# Patient Record
Sex: Male | Born: 1965 | Race: White | Hispanic: No | Marital: Married | Smoking: Former smoker
Health system: Southern US, Community
[De-identification: ages and names within clinical notes are randomized; demographics above are authoritative.]

## PROBLEM LIST (undated history)

## (undated) DIAGNOSIS — K5792 Diverticulitis of intestine, part unspecified, without perforation or abscess without bleeding: Secondary | ICD-10-CM

## (undated) HISTORY — PX: HERNIA REPAIR: SHX51

## (undated) HISTORY — PX: OTHER SURGICAL HISTORY: SHX169

---

## 2017-05-31 ENCOUNTER — Encounter (HOSPITAL_COMMUNITY): Payer: Self-pay | Admitting: Emergency Medicine

## 2017-05-31 ENCOUNTER — Ambulatory Visit (HOSPITAL_COMMUNITY)
Admission: EM | Admit: 2017-05-31 | Discharge: 2017-05-31 | Disposition: A | Discharge status: Home / Self Care | Payer: BLUE CROSS/BLUE SHIELD | Attending: Family Medicine | Admitting: Family Medicine

## 2017-05-31 DIAGNOSIS — R110 Nausea: Secondary | ICD-10-CM

## 2017-05-31 DIAGNOSIS — R1084 Generalized abdominal pain: Secondary | ICD-10-CM

## 2017-05-31 DIAGNOSIS — R197 Diarrhea, unspecified: Secondary | ICD-10-CM

## 2017-05-31 MED ORDER — DICYCLOMINE HCL 20 MG PO TABS: 20.0000 mg | ORAL_TABLET | Freq: Two times a day (BID) | ORAL | 0 refills | Status: DC

## 2017-05-31 MED ORDER — ONDANSETRON 4 MG PO TBDP: 4.0000 mg | ORAL_TABLET | Freq: Three times a day (TID) | ORAL | 0 refills | Status: DC | PRN

## 2017-05-31 MED ORDER — LOPERAMIDE HCL 2 MG PO CAPS: 2.0000 mg | ORAL_CAPSULE | Freq: Four times a day (QID) | ORAL | 0 refills | Status: DC | PRN

## 2017-05-31 NOTE — ED Triage Notes (Signed)
[facility] a complaint of lower abdominal pain with nausea that started this morning after eating. The patient reported a loose stool earlier this am.

## 2017-05-31 NOTE — ED Provider Notes (Signed)
CSN: [id number]     Arrival date & time 05/31/17  1948 History   None    Chief Complaint  Patient presents with  . Abdominal Pain   (Consider location/radiation/quality/duration/timing/severity/associated sxs/prior Treatment) The history is provided by the patient.  Abdominal Pain  Pain location:  Generalized Pain quality: bloating and cramping   Pain radiation: non radiating  Pain severity:  Moderate Onset quality:  Gradual Timing:  Intermittent Progression:  Waxing and waning Chronicity:  New Relieved by:  Nothing Worsened by:  Eating Associated symptoms: diarrhea and nausea   Associated symptoms: no belching, no dysuria, no fatigue, no fever, no hematemesis, no hematochezia, no hematuria and no melena   51 yo male presented with CC of generalized cramping abdominal pain with nausea started after eating breakfast in [employer]. Sx worsens after eating lunch. Denies blood or mucus in stool. Denies fever/chills.    History reviewed. No pertinent past medical history. History reviewed. No pertinent surgical history. History reviewed. No pertinent family history. Social History  Substance Use Topics  . Smoking status: Current Every Day Smoker    Packs/day: 1.00    Years: 20.00    Types: Cigarettes  . Smokeless tobacco: Never Used  . Alcohol use Yes    Review of Systems  Constitutional: Negative for fatigue and fever.  Gastrointestinal: Positive for abdominal pain, diarrhea and nausea. Negative for hematemesis, hematochezia and melena.  Genitourinary: Negative for dysuria, flank pain, frequency, hematuria and urgency.    Allergies  Patient has no known allergies.  Home Medications   Prior to Admission medications   Not on File   Meds Ordered and Administered this Visit  Medications - No data to display  BP 115/64 (BP Location: Right Arm)   Pulse 89   Temp 98.4 F (36.9 C) (Oral)   Resp 18   SpO2 97%  No data found.   Physical Exam  Constitutional: He is  oriented to person, place, and time. He appears well-developed and well-nourished. No distress.  HENT:  Head: Normocephalic.  Eyes: Pupils are equal, round, and reactive to light.  Cardiovascular: Normal rate, regular rhythm and normal heart sounds.   Pulmonary/Chest: Effort normal and breath sounds normal. No respiratory distress.  Abdominal: Soft. Bowel sounds are normal. He exhibits no distension and no mass. There is tenderness (diffuse cramping abdominal pain with diarrhea and nausea. Reports 4-5 loose stools since morning, worse afetr eating. Denies blood or mucus in stool. Denies fever/chills. Denies change in urinary habits). There is no rebound and no guarding.  Neurological: He is alert and oriented to person, place, and time.  Skin: Skin is warm.    Urgent Care Course     Procedures (including critical care time)  Labs Review Labs Reviewed - No data to display  Imaging Review No results found.   Visual Acuity Review  Right Eye Distance:   Left Eye Distance:   Bilateral Distance:    Right Eye Near:   Left Eye Near:    Bilateral Near:         MDM   1. Generalized abdominal pain   2. Diarrhea, unspecified type   3. Nausea without vomiting   Highly likely a viral gastroenteritis. Advised for Bowel rest x 24 hrs. Clear liquid and lactose free diet. Slowly introduced BRAT diet as tolerated. If Sx worsens return to clinic or go to ED.    Reinaldo RaddleMultani, Mahreen Schewe, NP 05/31/17 2042

## 2017-05-31 NOTE — Discharge Instructions (Signed)
Advised for Bowel rest x 24 hrs. Clear liquid and lactose free diet. Slowly introduced BRAT diet as tolerated. If Sx worsens return to clinic or go to ED.

## 2017-06-05 ENCOUNTER — Inpatient Hospital Stay (HOSPITAL_COMMUNITY): Payer: BLUE CROSS/BLUE SHIELD | Admitting: Certified Registered Nurse Anesthetist

## 2017-06-05 ENCOUNTER — Inpatient Hospital Stay: Admit: 2017-06-05 | Payer: BLUE CROSS/BLUE SHIELD | Admitting: General Surgery

## 2017-06-05 ENCOUNTER — Inpatient Hospital Stay (HOSPITAL_COMMUNITY)
Admission: EM | Admit: 2017-06-05 | Discharge: 2017-06-11 | DRG: 329 | Disposition: A | Discharge status: Home / Self Care | Payer: BLUE CROSS/BLUE SHIELD | Attending: Surgery | Admitting: Surgery

## 2017-06-05 ENCOUNTER — Encounter (HOSPITAL_COMMUNITY): Payer: Self-pay

## 2017-06-05 ENCOUNTER — Encounter (HOSPITAL_COMMUNITY): Admission: EM | Disposition: A | Discharge status: Home / Self Care | Payer: Self-pay | Source: Home / Self Care

## 2017-06-05 ENCOUNTER — Emergency Department (HOSPITAL_COMMUNITY): Payer: BLUE CROSS/BLUE SHIELD

## 2017-06-05 DIAGNOSIS — F17210 Nicotine dependence, cigarettes, uncomplicated: Secondary | ICD-10-CM | POA: Diagnosis present

## 2017-06-05 DIAGNOSIS — K5720 Diverticulitis of large intestine with perforation and abscess without bleeding: Secondary | ICD-10-CM | POA: Diagnosis present

## 2017-06-05 DIAGNOSIS — Z79899 Other long term (current) drug therapy: Secondary | ICD-10-CM | POA: Diagnosis not present

## 2017-06-05 DIAGNOSIS — R188 Other ascites: Secondary | ICD-10-CM | POA: Diagnosis present

## 2017-06-05 DIAGNOSIS — K651 Peritoneal abscess: Secondary | ICD-10-CM | POA: Diagnosis present

## 2017-06-05 DIAGNOSIS — K56609 Unspecified intestinal obstruction, unspecified as to partial versus complete obstruction: Secondary | ICD-10-CM | POA: Diagnosis present

## 2017-06-05 DIAGNOSIS — K668 Other specified disorders of peritoneum: Secondary | ICD-10-CM

## 2017-06-05 DIAGNOSIS — K631 Perforation of intestine (nontraumatic): Secondary | ICD-10-CM | POA: Diagnosis present

## 2017-06-05 HISTORY — PX: COLON RESECTION: SHX5231

## 2017-06-05 LAB — CBC WITH DIFFERENTIAL/PLATELET
BASOS PCT: 0 %
Basophils Absolute: 0 10*3/uL (ref 0.0–0.1)
EOS ABS: 0.2 10*3/uL (ref 0.0–0.7)
EOS PCT: 2 %
HCT: 43.7 % (ref 39.0–52.0)
Hemoglobin: 15.8 g/dL (ref 13.0–17.0)
Lymphocytes Relative: 9 %
Lymphs Abs: 0.9 10*3/uL (ref 0.7–4.0)
MCH: 31.5 pg (ref 26.0–34.0)
MCHC: 36.2 g/dL — AB (ref 30.0–36.0)
MCV: 87.1 fL (ref 78.0–100.0)
MONO ABS: 1.4 10*3/uL — AB (ref 0.1–1.0)
MONOS PCT: 14 %
NEUTROS PCT: 75 %
Neutro Abs: 7.7 10*3/uL (ref 1.7–7.7)
PLATELETS: 281 10*3/uL (ref 150–400)
RBC: 5.02 MIL/uL (ref 4.22–5.81)
RDW: 12.5 % (ref 11.5–15.5)
WBC: 10.2 10*3/uL (ref 4.0–10.5)

## 2017-06-05 LAB — LIPASE, BLOOD: Lipase: 19 U/L (ref 11–51)

## 2017-06-05 LAB — COMPREHENSIVE METABOLIC PANEL
ALBUMIN: 3.4 g/dL — AB (ref 3.5–5.0)
ALT: 21 U/L (ref 17–63)
ANION GAP: 11 (ref 5–15)
AST: 23 U/L (ref 15–41)
Alkaline Phosphatase: 54 U/L (ref 38–126)
BUN: 19 mg/dL (ref 6–20)
CO2: 29 mmol/L (ref 22–32)
Calcium: 8.9 mg/dL (ref 8.9–10.3)
Chloride: 91 mmol/L — ABNORMAL LOW (ref 101–111)
Creatinine, Ser: 0.77 mg/dL (ref 0.61–1.24)
GFR calc Af Amer: >60 mL/min (ref >60)
GFR calc non Af Amer: >60 mL/min (ref >60)
GLUCOSE: 104 mg/dL — AB (ref 65–99)
POTASSIUM: 3.3 mmol/L — AB (ref 3.5–5.1)
SODIUM: 131 mmol/L — AB (ref 135–145)
Total Bilirubin: 1.1 mg/dL (ref 0.3–1.2)
Total Protein: 7.3 g/dL (ref 6.5–8.1)

## 2017-06-05 SURGERY — EXCISION, SMALL INTESTINE, LAPAROSCOPIC
Anesthesia: General

## 2017-06-05 SURGERY — COLON RESECTION LAPAROSCOPIC
Anesthesia: General

## 2017-06-05 MED ORDER — SODIUM CHLORIDE 0.9 % IV BOLUS (SEPSIS)
1000.0000 mL | Freq: Once | INTRAVENOUS | Status: AC
  Administered 2017-06-05: 1000 mL via INTRAVENOUS

## 2017-06-05 MED ORDER — ONDANSETRON HCL 4 MG/2ML IJ SOLN: 4.0000 mg | Freq: Four times a day (QID) | INTRAMUSCULAR | Status: DC | PRN

## 2017-06-05 MED ORDER — ALBUTEROL SULFATE HFA 108 (90 BASE) MCG/ACT IN AERS
INHALATION_SPRAY | RESPIRATORY_TRACT | Status: AC
  Filled 2017-06-05: qty 6.7

## 2017-06-05 MED ORDER — IOPAMIDOL (ISOVUE-300) INJECTION 61%
100.0000 mL | Freq: Once | INTRAVENOUS | Status: AC | PRN
  Administered 2017-06-05: 100 mL via INTRAVENOUS

## 2017-06-05 MED ORDER — HYDROMORPHONE HCL 1 MG/ML IJ SOLN
INTRAMUSCULAR | Status: AC
  Filled 2017-06-05: qty 0.5

## 2017-06-05 MED ORDER — SUCCINYLCHOLINE CHLORIDE 20 MG/ML IJ SOLN
INTRAMUSCULAR | Status: DC | PRN
  Administered 2017-06-05: 120 mg via INTRAVENOUS

## 2017-06-05 MED ORDER — SODIUM CHLORIDE 0.9 % IV BOLUS (SEPSIS): 1000.0000 mL | Freq: Once | INTRAVENOUS | Status: DC

## 2017-06-05 MED ORDER — ONDANSETRON HCL 4 MG/2ML IJ SOLN
4.0000 mg | Freq: Once | INTRAMUSCULAR | Status: AC
  Administered 2017-06-05: 4 mg via INTRAVENOUS
  Filled 2017-06-05: qty 2

## 2017-06-05 MED ORDER — LIDOCAINE 2% (20 MG/ML) 5 ML SYRINGE
INTRAMUSCULAR | Status: AC
  Filled 2017-06-05: qty 5

## 2017-06-05 MED ORDER — HYDROMORPHONE HCL 1 MG/ML IJ SOLN
0.5000 mg | INTRAMUSCULAR | Status: DC | PRN
  Administered 2017-06-05: 1 mg via INTRAVENOUS
  Administered 2017-06-06: 2 mg via INTRAVENOUS
  Administered 2017-06-06 (×4): 1 mg via INTRAVENOUS
  Administered 2017-06-06 (×2): 2 mg via INTRAVENOUS
  Administered 2017-06-07: 1 mg via INTRAVENOUS
  Filled 2017-06-05 (×2): qty 1
  Filled 2017-06-05 (×3): qty 2
  Filled 2017-06-05 (×4): qty 1

## 2017-06-05 MED ORDER — SUGAMMADEX SODIUM 200 MG/2ML IV SOLN
INTRAVENOUS | Status: DC | PRN
  Administered 2017-06-05: 150 mg via INTRAVENOUS

## 2017-06-05 MED ORDER — PANTOPRAZOLE SODIUM 40 MG IV SOLR
40.0000 mg | Freq: Two times a day (BID) | INTRAVENOUS | Status: DC
  Administered 2017-06-06 – 2017-06-09 (×7): 40 mg via INTRAVENOUS
  Filled 2017-06-05 (×8): qty 40

## 2017-06-05 MED ORDER — PROMETHAZINE HCL 25 MG/ML IJ SOLN: 6.2500 mg | INTRAMUSCULAR | Status: DC | PRN

## 2017-06-05 MED ORDER — MORPHINE SULFATE (PF) 4 MG/ML IV SOLN
6.0000 mg | Freq: Once | INTRAVENOUS | Status: AC
  Administered 2017-06-05: 6 mg via INTRAVENOUS
  Filled 2017-06-05: qty 2

## 2017-06-05 MED ORDER — SODIUM CHLORIDE 0.9 % IV SOLN
INTRAVENOUS | Status: DC | PRN
Start: 2017-06-05 — End: 2017-06-05
  Administered 2017-06-05: 20:00:00 via INTRAVENOUS

## 2017-06-05 MED ORDER — DIPHENHYDRAMINE HCL 50 MG/ML IJ SOLN: 25.0000 mg | Freq: Four times a day (QID) | INTRAMUSCULAR | Status: DC | PRN

## 2017-06-05 MED ORDER — FENTANYL CITRATE (PF) 250 MCG/5ML IJ SOLN
INTRAMUSCULAR | Status: AC
  Filled 2017-06-05: qty 5

## 2017-06-05 MED ORDER — PROPOFOL 10 MG/ML IV BOLUS
INTRAVENOUS | Status: AC
  Filled 2017-06-05: qty 20

## 2017-06-05 MED ORDER — ALBUTEROL SULFATE (2.5 MG/3ML) 0.083% IN NEBU
2.5000 mg | INHALATION_SOLUTION | Freq: Four times a day (QID) | RESPIRATORY_TRACT | Status: DC | PRN
  Administered 2017-06-06: 2.5 mg via RESPIRATORY_TRACT
  Filled 2017-06-05: qty 3

## 2017-06-05 MED ORDER — POTASSIUM CHLORIDE IN NACL 40-0.9 MEQ/L-% IV SOLN
INTRAVENOUS | Status: DC
  Administered 2017-06-06 (×2): 125 mL/h via INTRAVENOUS
  Filled 2017-06-05 (×2): qty 1000

## 2017-06-05 MED ORDER — PIPERACILLIN-TAZOBACTAM 3.375 G IVPB
3.3750 g | Freq: Three times a day (TID) | INTRAVENOUS | Status: DC
  Administered 2017-06-06 – 2017-06-07 (×4): 3.375 g via INTRAVENOUS
  Filled 2017-06-05 (×7): qty 50

## 2017-06-05 MED ORDER — HYDROMORPHONE HCL 1 MG/ML IJ SOLN
INTRAMUSCULAR | Status: AC
  Filled 2017-06-05: qty 1

## 2017-06-05 MED ORDER — LACTATED RINGERS IV SOLN
INTRAVENOUS | Status: DC | PRN
  Administered 2017-06-05 (×2): via INTRAVENOUS

## 2017-06-05 MED ORDER — MIDAZOLAM HCL 2 MG/2ML IJ SOLN
INTRAMUSCULAR | Status: AC
  Filled 2017-06-05: qty 2

## 2017-06-05 MED ORDER — HYDROMORPHONE HCL 1 MG/ML IJ SOLN: 0.5000 mg | INTRAMUSCULAR | Status: DC | PRN

## 2017-06-05 MED ORDER — ONDANSETRON HCL 4 MG/2ML IJ SOLN
INTRAMUSCULAR | Status: DC | PRN
  Administered 2017-06-05: 4 mg via INTRAVENOUS

## 2017-06-05 MED ORDER — HYDROMORPHONE HCL 2 MG/ML IJ SOLN
INTRAMUSCULAR | Status: AC
  Filled 2017-06-05: qty 1

## 2017-06-05 MED ORDER — ROCURONIUM BROMIDE 100 MG/10ML IV SOLN
INTRAVENOUS | Status: DC | PRN
  Administered 2017-06-05: 20 mg via INTRAVENOUS
  Administered 2017-06-05: 40 mg via INTRAVENOUS
  Administered 2017-06-05: 10 mg via INTRAVENOUS

## 2017-06-05 MED ORDER — HYDROMORPHONE HCL 1 MG/ML IJ SOLN
INTRAMUSCULAR | Status: DC | PRN
  Administered 2017-06-05 (×2): 1 mg via INTRAVENOUS

## 2017-06-05 MED ORDER — PIPERACILLIN-TAZOBACTAM 3.375 G IVPB 30 MIN
3.3750 g | Freq: Once | INTRAVENOUS | Status: AC
  Administered 2017-06-05: 3.375 g via INTRAVENOUS
  Filled 2017-06-05: qty 50

## 2017-06-05 MED ORDER — OXYCODONE HCL 5 MG PO TABS: 5.0000 mg | ORAL_TABLET | Freq: Once | ORAL | Status: DC | PRN

## 2017-06-05 MED ORDER — SUGAMMADEX SODIUM 200 MG/2ML IV SOLN
INTRAVENOUS | Status: AC
  Filled 2017-06-05: qty 2

## 2017-06-05 MED ORDER — FENTANYL CITRATE (PF) 100 MCG/2ML IJ SOLN
INTRAMUSCULAR | Status: DC | PRN
  Administered 2017-06-05: 100 ug via INTRAVENOUS
  Administered 2017-06-05 (×2): 50 ug via INTRAVENOUS
  Administered 2017-06-05 (×2): 100 ug via INTRAVENOUS
  Administered 2017-06-05 (×2): 50 ug via INTRAVENOUS

## 2017-06-05 MED ORDER — PROPOFOL 10 MG/ML IV BOLUS
INTRAVENOUS | Status: DC | PRN
  Administered 2017-06-05: 120 mg via INTRAVENOUS

## 2017-06-05 MED ORDER — HYDROMORPHONE HCL 1 MG/ML IJ SOLN
0.2500 mg | INTRAMUSCULAR | Status: DC | PRN
  Administered 2017-06-05 (×2): 0.25 mg via INTRAVENOUS
  Administered 2017-06-05: 0.5 mg via INTRAVENOUS
  Administered 2017-06-05 (×2): 0.25 mg via INTRAVENOUS
  Administered 2017-06-05: 0.5 mg via INTRAVENOUS

## 2017-06-05 MED ORDER — DEXAMETHASONE SODIUM PHOSPHATE 4 MG/ML IJ SOLN
INTRAMUSCULAR | Status: DC | PRN
  Administered 2017-06-05: 10 mg via INTRAVENOUS

## 2017-06-05 MED ORDER — NICOTINE 14 MG/24HR TD PT24
14.0000 mg | MEDICATED_PATCH | Freq: Every day | TRANSDERMAL | Status: DC
  Filled 2017-06-05 (×4): qty 1

## 2017-06-05 MED ORDER — OXYCODONE HCL 5 MG/5ML PO SOLN
5.0000 mg | Freq: Once | ORAL | Status: DC | PRN
  Filled 2017-06-05: qty 5

## 2017-06-05 MED ORDER — MORPHINE SULFATE (PF) 4 MG/ML IV SOLN: 4.0000 mg | Freq: Once | INTRAVENOUS | Status: DC

## 2017-06-05 MED ORDER — LIDOCAINE HCL (CARDIAC) 20 MG/ML IV SOLN
INTRAVENOUS | Status: DC | PRN
  Administered 2017-06-05: 75 mg via INTRAVENOUS
  Administered 2017-06-05: 25 mg via INTRAVENOUS

## 2017-06-05 MED ORDER — IOPAMIDOL (ISOVUE-300) INJECTION 61%
INTRAVENOUS | Status: AC
  Administered 2017-06-05: 100 mL via INTRAVENOUS
  Filled 2017-06-05: qty 100

## 2017-06-05 MED ORDER — ROCURONIUM BROMIDE 50 MG/5ML IV SOSY
PREFILLED_SYRINGE | INTRAVENOUS | Status: AC
  Filled 2017-06-05: qty 5

## 2017-06-05 MED ORDER — ONDANSETRON 4 MG PO TBDP: 4.0000 mg | ORAL_TABLET | Freq: Four times a day (QID) | ORAL | Status: DC | PRN

## 2017-06-05 MED ORDER — DIPHENHYDRAMINE HCL 25 MG PO CAPS: 25.0000 mg | ORAL_CAPSULE | Freq: Four times a day (QID) | ORAL | Status: DC | PRN

## 2017-06-05 MED ORDER — SUCCINYLCHOLINE CHLORIDE 200 MG/10ML IV SOSY
PREFILLED_SYRINGE | INTRAVENOUS | Status: AC
  Filled 2017-06-05: qty 10

## 2017-06-05 MED ORDER — MIDAZOLAM HCL 5 MG/5ML IJ SOLN
INTRAMUSCULAR | Status: DC | PRN
  Administered 2017-06-05: 2 mg via INTRAVENOUS

## 2017-06-05 SURGICAL SUPPLY — 64 items
APPLIER CLIP ROT 10 11.4 M/L (STAPLE)
BLADE EXTENDED COATED 6.5IN (ELECTRODE) ×3 IMPLANT
CABLE HIGH FREQUENCY MONO STRZ (ELECTRODE) IMPLANT
CELLS DAT CNTRL 66122 CELL SVR (MISCELLANEOUS) IMPLANT
CLIP APPLIE ROT 10 11.4 M/L (STAPLE) IMPLANT
COVER SURGICAL LIGHT HANDLE (MISCELLANEOUS) ×3 IMPLANT
CUTTER FLEX LINEAR 45M (STAPLE) IMPLANT
DECANTER SPIKE VIAL GLASS SM (MISCELLANEOUS) IMPLANT
DERMABOND ADVANCED (GAUZE/BANDAGES/DRESSINGS)
DERMABOND ADVANCED .7 DNX12 (GAUZE/BANDAGES/DRESSINGS) IMPLANT
DRAIN CHANNEL 19F RND (DRAIN) IMPLANT
DRAPE LAPAROSCOPIC ABDOMINAL (DRAPES) ×6 IMPLANT
DRAPE UTILITY XL STRL (DRAPES) ×3 IMPLANT
DRAPE WARM FLUID 44X44 (DRAPE) ×3 IMPLANT
DRSG OPSITE POSTOP 4X10 (GAUZE/BANDAGES/DRESSINGS) IMPLANT
DRSG OPSITE POSTOP 4X6 (GAUZE/BANDAGES/DRESSINGS) IMPLANT
DRSG OPSITE POSTOP 4X8 (GAUZE/BANDAGES/DRESSINGS) IMPLANT
ELECT PENCIL ROCKER SW 15FT (MISCELLANEOUS) ×3 IMPLANT
ELECT REM PT RETURN 15FT ADLT (MISCELLANEOUS) ×3 IMPLANT
GAUZE SPONGE 4X4 12PLY STRL (GAUZE/BANDAGES/DRESSINGS) IMPLANT
GLOVE BIOGEL PI IND STRL 7.5 (GLOVE) ×6 IMPLANT
GLOVE BIOGEL PI INDICATOR 7.5 (GLOVE) ×12
GLOVE ECLIPSE 7.5 STRL STRAW (GLOVE) ×6 IMPLANT
GOWN STRL REUS W/TWL XL LVL3 (GOWN DISPOSABLE) ×9 IMPLANT
IRRIG SUCT STRYKERFLOW 2 WTIP (MISCELLANEOUS)
IRRIGATION SUCT STRKRFLW 2 WTP (MISCELLANEOUS) IMPLANT
LEGGING LITHOTOMY PAIR STRL (DRAPES) IMPLANT
LIGASURE IMPACT 36 18CM CVD LR (INSTRUMENTS) ×3 IMPLANT
PACK COLON (CUSTOM PROCEDURE TRAY) IMPLANT
PACK GENERAL/GYN (CUSTOM PROCEDURE TRAY) ×3 IMPLANT
PORT LAP GEL ALEXIS MED 5-9CM (MISCELLANEOUS) IMPLANT
RELOAD BLUE CHELON 45 (STAPLE) IMPLANT
RELOAD PROXIMATE 75MM BLUE (ENDOMECHANICALS) ×3 IMPLANT
RTRCTR WOUND ALEXIS 18CM MED (MISCELLANEOUS)
SCISSORS LAP 5X35 DISP (ENDOMECHANICALS) IMPLANT
SEALER TISSUE X1 CVD JAW (INSTRUMENTS) IMPLANT
SHEARS HARMONIC ACE PLUS 36CM (ENDOMECHANICALS) IMPLANT
SLEEVE ADV FIXATION 5X100MM (TROCAR) IMPLANT
SOLUTION ANTI FOG 6CC (MISCELLANEOUS) IMPLANT
STAPLER PROXIMATE 75MM BLUE (STAPLE) ×3 IMPLANT
STAPLER VISISTAT 35W (STAPLE) IMPLANT
SUT MNCRL AB 4-0 PS2 18 (SUTURE) IMPLANT
SUT PDS AB 0 CT1 36 (SUTURE) IMPLANT
SUT PDS AB 0 CTX 60 (SUTURE) ×6 IMPLANT
SUT PDS AB 1 CTX 36 (SUTURE) IMPLANT
SUT PDS AB 1 TP1 96 (SUTURE) IMPLANT
SUT PROLENE 2 0 KS (SUTURE) IMPLANT
SUT PROLENE 3 0 SH 48 (SUTURE) ×3 IMPLANT
SUT SILK 2 0 (SUTURE) ×2
SUT SILK 2 0 SH CR/8 (SUTURE) ×3 IMPLANT
SUT SILK 2-0 18XBRD TIE 12 (SUTURE) ×1 IMPLANT
SUT SILK 3 0 (SUTURE) ×2
SUT SILK 3 0 SH CR/8 (SUTURE) ×3 IMPLANT
SUT SILK 3-0 18XBRD TIE 12 (SUTURE) ×1 IMPLANT
SUT VIC AB 3-0 SH 18 (SUTURE) ×3 IMPLANT
SYS LAPSCP GELPORT 120MM (MISCELLANEOUS)
SYSTEM LAPSCP GELPORT 120MM (MISCELLANEOUS) IMPLANT
TOWEL OR NON WOVEN STRL DISP B (DISPOSABLE) ×6 IMPLANT
TRAY FOLEY W/METER SILVER 16FR (SET/KITS/TRAYS/PACK) ×3 IMPLANT
TROCAR ADV FIXATION 12X100MM (TROCAR) IMPLANT
TROCAR ADV FIXATION 5X100MM (TROCAR) IMPLANT
TROCAR BLADELESS OPT 5 100 (ENDOMECHANICALS) IMPLANT
TROCAR XCEL BLUNT TIP 100MML (ENDOMECHANICALS) IMPLANT
TUBING INSUF HEATED (TUBING) IMPLANT

## 2017-06-05 NOTE — Progress Notes (Signed)
Pharmacy Antibiotic Note  Colton Stanley is a 51 y.o. male admitted on 06/05/2017 with intra-abdominal infection.  Patient brought from urgent care with SBO with potential perforation.  Patient received Zosyn 3.375gm IV x 1 dose in the ED.  Pharmacy has been consulted for Zosyn dosing.  Plan: Zosyn 3.375gm IV q8h (each dose infused over 4 hrs)  Height: 5\' 9"  (175.3 cm) Weight: 120 lb (54.4 kg) IBW/kg (Calculated) : 70.7  Temp (24hrs), Avg:98.9 F (37.2 C), Min:98.9 F (37.2 C), Max:98.9 F (37.2 C)   Recent Labs Lab 06/05/17 1218  WBC 10.2  CREATININE 0.77    Estimated Creatinine Clearance: 84.1 mL/min (by C-G formula based on SCr of 0.77 mg/dL).    No Known Allergies  Antimicrobials this admission: 6/18 zosyn >>     Thank you for allowing pharmacy to be a part of this patient's care.  Maryellen PilePoindexter, Anabelle Bungert Trefz, PharmD 06/05/2017 4:08 PM

## 2017-06-05 NOTE — Anesthesia Postprocedure Evaluation (Signed)
Anesthesia Post Note  Patient: Colton Stanley  Procedure(s) Performed: Procedure(s) (LRB): exploratory laparotomy, sigmoid colon resection, colostomy (N/A)     Patient location during evaluation: PACU Anesthesia Type: General Level of consciousness: awake and alert Pain management: pain level controlled Vital Signs Assessment: post-procedure vital signs reviewed and stable Respiratory status: spontaneous breathing, nonlabored ventilation, respiratory function stable and patient connected to nasal cannula oxygen Cardiovascular status: blood pressure returned to baseline and stable Postop Assessment: no signs of nausea or vomiting Anesthetic complications: no    Last Vitals:  Vitals:   06/05/17 2215 06/05/17 2238  BP:  124/72  Pulse:  75  Resp:  17  Temp: 37.2 C 36.8 C    Last Pain:  Vitals:   06/05/17 2215  TempSrc:   PainSc: 5                  Katricia Prehn P Nethaniel Mattie

## 2017-06-05 NOTE — Op Note (Signed)
Preoperative Diagnosis: Pelvic abscess and small bowel obstruction  Postoprative Diagnosis: Same secondary to perforated sigmoid diverticulitis with abscess  Procedure: Procedure(s): exploratory laparotomy, sigmoid colon resection, colostomy  Gertie Gowda colectomy)   Surgeon: Glenna Fellows T   Assistants: None  Anesthesia:  General endotracheal anesthesia  Indications: Patient is a 51 year old male who presents with 5-6 days of generalized crampy abdominal pain, obstipation and nausea. CT scan has shown high-grade small bowel obstruction with transition and thickened loops of small bowel in the pelvis with a small amount of intraperitoneal free air consistent with perforation. After discussion of the situation and options for treatment with the patient and his wife will elected to proceed with exploratory laparotomy. We discussed possible procedures including temporary colostomy.    Procedure Detail:  Patient was taken to the operating room, placed in supine position on the operating table, and general endotracheal anesthesia induced. He received broad-spectrum preoperative IV antibiotics. PAS were in place. Foley catheter and NG tube were placed. The abdomen was widely sterilely prepped and draped. Patient timeout was performed and correct procedure verified. A low midline incision below the umbilicus was used and dissection carried down through the subcutaneous tissue and midline fascia. The peritoneum was carefully opened. There was a moderate amount of straw-colored ascites was suctioned. There were multiple significantly dilated loops of small bowel. As I traced these down in the pelvis there were inflammatory adhesions between loops of small bowel with exudate and then some dense inflammatory adhesions to the mid sigmoid colon which was severely inflamed. As these were bluntly dissected off an abscess cavity was entered with frank purulence which was cultured and suctioned and evacuated.  The small bowel loops were all carefully bluntly dissected away from the sigmoid colon and there was a transition point here in the mid ileum secondary to these inflammatory adhesions. These were all completely broken up and was no intrinsic abnormality of the small intestine which was traced down to the terminal ileum and cecum and appendix which were all normal. There was an obvious segment of severely inflamed sigmoid colon with a small perforation. I elected proceed with [facility] colectomy. The sigmoid colon was mobilized dividing lateral peritoneal attachments. There were fairly dense inflammatory adhesions to the lateral pelvic sidewall. I could not identify the ureter due to the inflammation but the dissection was kept very close to the colon and I really did not go back into the retroperitoneum. After mobilization a point of proximal resection just proximal to the severely inflamed area was chosen and was cleaned of mesentery and divided with the GIA 75 mm stapler. Staying very close to the bowel wall the mesentery of the sigmoid colon was sequentially divided with the LigaSure working distally until the severely inflamed segment was mobilized up out of the pelvis and I encountered soft normal-appearing distal rectosigmoid. This was cleared of mesentery and divided with a second firing of the GIA 75 mm stapler and the specimen removed. The abdomen was thoroughly irrigated with multiple liters of warm saline. Hemostasis was assured. The small bowel was again carefully traced proximal to distal and was very dilated but no intrinsic abnormality. The NG tube was confirmed in the stomach. The rectosigmoid stump was marked with 2 2-0 Prolene sutures one on either end of the staple line. The left and proximal sigmoid colon was further mobilized in preparation for colostomy dividing lateral peritoneal attachments. A site for the colostomy in the left lower quadrant was chosen and a skin and subcutaneous button  excised. The anterior fascia was divided in a cruciate fashion. The rectus muscle was bluntly split and the peritoneum opened and dilated. The end of the sigmoid colon was brought out under no tension for the ostomy. The abdomen was again thoroughly irrigated and hemostasis assured. The omentum was brought down over loops of small bowel. The midline fascia was closed using running looped #1 PDS begun at either end of the incision and tied centrally. The ostomy was opened and everted and matured with interrupted 3-0 Vicryl. The midline wound was packed with moist saline gauze. Sponge and needle counts were counts were correct. Ostomy device and costal dressing were applied.    Findings: Perforated sigmoid diverticulitis with abscess and secondary small bowel obstruction  Estimated Blood Loss:  150 mL         Drains: None  Blood Given: none          Specimens: Sigmoid colon, culture and sensitivity        Complications:  * No complications entered in OR log *         Disposition: PACU - hemodynamically stable.         Condition: stable

## 2017-06-05 NOTE — ED Notes (Signed)
Pt is alert and oriented x 4 and is verbally responsive accompanied by spouse. Pt states that he has been having generalized abdominal x 1 week, described as sharp and constant.

## 2017-06-05 NOTE — ED Triage Notes (Signed)
[facility] Pt was c/o abd. pain x 5 days x 4 quads 8-10 described as sharp. Pt was seen in urgent care and had an Xray that showed SBO with potential perforation per EMS.    VS 60, RR 18, BP 126/86

## 2017-06-05 NOTE — ED Notes (Signed)
Called floor for transfer of patient.  RN busy at this time.  Will try again in 15 minutes.

## 2017-06-05 NOTE — ED Notes (Signed)
Floor stated RN is admitting another patient to the floor right now and will call me back.

## 2017-06-05 NOTE — ED Provider Notes (Signed)
[facility]-EMERGENCY DEPT Provider Note   CSN: [id number] Arrival date & time: 06/05/17  1204     History   Chief Complaint Chief Complaint  Patient presents with  . Abdominal Pain    HPI Colton Stanley is a 51 y.o. male.  HPI Patient presents emergency department with increasing abdominal discomfort over the past 4 days.  He describes the discomfort as sharp.  He was sent today from the urgent care after a plain film demonstrated small bowel structure with possible free air.  He's never had abdominal surgery.  No prior history of diverticulitis.  No fevers or chills.  Reports some nausea without vomiting.  Denies diarrhea.  No blood in his stool.  No recent weight change or weight loss.  No recent anorexia.  No history of cancer.   History reviewed. No pertinent past medical history.  There are no active problems to display for this patient.   No past surgical history on file.     Home Medications    Prior to Admission medications   Medication Sig Start Date End Date Taking? Authorizing Provider  dicyclomine (BENTYL) 20 MG tablet Take 1 tablet (20 mg total) by mouth 2 (two) times daily. 05/31/17  Yes Multani, Bhupinder, NP  loperamide (IMODIUM) 2 MG capsule Take 1 capsule (2 mg total) by mouth 4 (four) times daily as needed for diarrhea or loose stools. 05/31/17  Yes Multani, Bhupinder, NP  ondansetron (ZOFRAN ODT) 4 MG disintegrating tablet Take 1 tablet (4 mg total) by mouth every 8 (eight) hours as needed for nausea or vomiting. 05/31/17  Yes Multani, Bhupinder, NP    Family History History reviewed. No pertinent family history.  Social History Social History  Substance Use Topics  . Smoking status: Current Every Day Smoker    Packs/day: 1.00    Years: 20.00    Types: Cigarettes  . Smokeless tobacco: Never Used  . Alcohol use Yes     Allergies   Patient has no known allergies.   Review of Systems Review of Systems  All other systems reviewed and are  negative.    Physical Exam Updated Vital Signs BP 118/83   Pulse 61   Temp 98.9 F (37.2 C) (Oral)   Resp 20   Ht 5\' 9"  (1.753 m)   Wt 54.4 kg (120 lb)   SpO2 99%   BMI 17.72 kg/m   Physical Exam  Constitutional: He is oriented to person, place, and time. He appears well-developed and well-nourished.  HENT:  Head: Normocephalic and atraumatic.  Eyes: EOM are normal.  Neck: Normal range of motion.  Cardiovascular: Normal rate, regular rhythm, normal heart sounds and intact distal pulses.   Pulmonary/Chest: Effort normal and breath sounds normal. No respiratory distress.  Abdominal: Soft. He exhibits no distension.  Mild generalized abdominal tenderness  Musculoskeletal: Normal range of motion.  Neurological: He is alert and oriented to person, place, and time.  Skin: Skin is warm and dry.  Psychiatric: He has a normal mood and affect. Judgment normal.  Nursing note and vitals reviewed.    ED Treatments / Results  Labs (all labs ordered are listed, but only abnormal results are displayed) Labs Reviewed  CBC WITH DIFFERENTIAL/PLATELET - Abnormal; Notable for the following:       Result Value   MCHC 36.2 (*)    Monocytes Absolute 1.4 (*)    All other components within normal limits  COMPREHENSIVE METABOLIC PANEL - Abnormal; Notable for the following:    Sodium 131 (*)  Potassium 3.3 (*)    Chloride 91 (*)    Glucose, Bld 104 (*)    Albumin 3.4 (*)    All other components within normal limits  LIPASE, BLOOD    EKG  EKG Interpretation None       Radiology Ct Abdomen Pelvis W Contrast  Result Date: 06/05/2017 CLINICAL DATA:  Upper abdominal pain. EXAM: CT ABDOMEN AND PELVIS WITH CONTRAST TECHNIQUE: Multidetector CT imaging of the abdomen and pelvis was performed using the standard protocol following bolus administration of intravenous contrast. CONTRAST:  100 mL Isovue-300 COMPARISON:  None. FINDINGS: Lower chest: No acute abnormality. Hepatobiliary: No  focal liver abnormality is seen. No gallstones, gallbladder wall thickening, or biliary dilatation. Pancreas: Unremarkable. No pancreatic ductal dilatation or surrounding inflammatory changes. Spleen: Normal in size without focal abnormality. Adrenals/Urinary Tract: Adrenal glands are unremarkable. Kidneys are normal, without renal calculi, focal lesion, or hydronephrosis. Bladder is unremarkable. Stomach/Bowel: Severe small bowel dilatation with a relative transition zone in the lower pelvis where there is bowel wall thickening. Small volume pneumoperitoneum most consistent with bowel perforation. Sigmoid diverticulosis without evidence of diverticulitis. No abdominal or pelvic free fluid. Vascular/Lymphatic: Mild abdominal aortic atherosclerosis. No lymphadenopathy. Reproductive: Prostate is unremarkable. Other: No abdominal wall hernia or abnormality. No abdominopelvic ascites. Musculoskeletal: No acute osseous abnormality. No lytic or sclerotic osseous lesion. IMPRESSION: 1. Small-bowel obstruction with pneumoperitoneum concerning for bowel perforation. Relative transition zone in the lower pelvis. Critical Value/emergent results were called by telephone at the time of interpretation on 06/05/2017 at 2:36 pm to Dr. Azalia Bilis , who verbally acknowledged these results. Electronically Signed   By: Elige Ko   On: 06/05/2017 14:38       +++++++++++++++++++++++++++++++++++++++++++++++++++  Procedures .Critical Care Performed by: Azalia Bilis Authorized by: Azalia Bilis    Total critical care time: 31 minutes Critical care time was exclusive of separately billable procedures and treating other patients. Critical care was necessary to treat or prevent imminent or life-threatening deterioration. Critical care was time spent personally by me on the following activities: development of treatment plan with patient and/or surrogate as well as nursing, discussions with consultants, evaluation of  patient's response to treatment, examination of patient, obtaining history from patient or surrogate, ordering and performing treatments and interventions, ordering and review of laboratory studies, ordering and review of radiographic studies, pulse oximetry and re-evaluation of patient's condition.  +++++++++++++++++++++++++++++++++++++++++++++++++++++    Medications Ordered in ED Medications  sodium chloride 0.9 % bolus 1,000 mL (0 mLs Intravenous Stopped 06/05/17 1437)  ondansetron (ZOFRAN) injection 4 mg (4 mg Intravenous Given 06/05/17 1309)  morphine 4 MG/ML injection 6 mg (6 mg Intravenous Given 06/05/17 1309)  piperacillin-tazobactam (ZOSYN) IVPB 3.375 g (3.375 g Intravenous New Bag/Given 06/05/17 1437)  iopamidol (ISOVUE-300) 61 % injection 100 mL (100 mLs Intravenous Contrast Given 06/05/17 1414)     Initial Impression / Assessment and Plan / ED Course  I have reviewed the triage vital signs and the nursing notes.  Pertinent labs & imaging results that were available during my care of the patient were reviewed by me and considered in my medical decision making (see chart for details).     IV Zosyn.  CT scan confirms small bowel obstruction with likely perforation.  NG tube now.  Discussed case with general surgery who will admit and plan on operative evaluation.  Patient has been nothing by mouth.  IV fluids were given.  Vital signs remained stable at this time.  Final Clinical Impressions(s) / ED  Diagnoses   Final diagnoses:  Small bowel obstruction [facility](HCC)  Peritoneal free air    New Prescriptions New Prescriptions   No medications on file     Azalia Bilisampos, Owen Pratte, MD 06/05/17 1534

## 2017-06-05 NOTE — ED Notes (Signed)
Bed: WA04 Expected date:  Expected time:  Means of arrival:  Comments: 

## 2017-06-05 NOTE — ED Notes (Signed)
Pt wiped down with CHG wipes 

## 2017-06-05 NOTE — H&P (Signed)
Colton Stanley is an 51 y.o. male.   Chief Complaint: abdominal pain, nausea and vomiting  HPI: Pt presented to the ED today after being seen in Urgent Care setting. He reported 5 days of pain, and pain in all 4 quadrants, 8/10 severity.  He was seen at [facility] and treated with Imodium, Zofran,  and Bentyl on 05/31/17.    He tried to drink some Gatorade but promptly vomited it up.  He has not gotten better, with ongoing pain for the last 5 days.  He returned to an Urgent CAre this AM and was sent to the ED here.   He continues to complain of lower abdominal pain after eating, he has also had some loose stools starting early AM last Wed, 6/13, he has not really been able to eat anything since that time, but has kept some liquids down.  .    Work up shows he is afebrile, And VSS.  Na 131, K+ 3.3, Cl 91, glucose 104. Albumin 3.4 WBC 10.2, H/H 15.8/43.40mplatelets are normal at 281.  CT scan shows Small-bowel obstruction with pneumoperitoneum concerning for  bowel perforation. Relative transition zone in the lower pelvis.  We are ask to see.    History reviewed. No pertinent past medical history.  No past surgical history on file.  History reviewed. No pertinent family history. Social History:  reports that he has been smoking Cigarettes.  He has a 20.00 pack-year smoking history. He has never used smokeless tobacco. He reports that he drinks alcohol. He reports that he uses drugs, including Marijuana.  Allergies: No Known Allergies  Prior to Admission medications   Medication Sig Start Date End Date Taking? Authorizing Provider  dicyclomine (BENTYL) 20 MG tablet Take 1 tablet (20 mg total) by mouth 2 (two) times daily. 05/31/17  Yes Multani, Bhupinder, NP  loperamide (IMODIUM) 2 MG capsule Take 1 capsule (2 mg total) by mouth 4 (four) times daily as needed for diarrhea or loose stools. 05/31/17  Yes Multani, Bhupinder, NP  ondansetron (ZOFRAN ODT) 4 MG disintegrating tablet Take 1 tablet (4 mg  total) by mouth every 8 (eight) hours as needed for nausea or vomiting. 05/31/17  Yes Multani, Bhupinder, NP     Results for orders placed or performed during the hospital encounter of 06/05/17 (from the past 48 hour(s))  CBC with Differential/Platelet     Status: Abnormal   Collection Time: 06/05/17 12:18 PM  Result Value Ref Range   WBC 10.2 4.0 - 10.5 K/uL   RBC 5.02 4.22 - 5.81 MIL/uL   Hemoglobin 15.8 13.0 - 17.0 g/dL   HCT 43.7 39.0 - 52.0 %   MCV 87.1 78.0 - 100.0 fL   MCH 31.5 26.0 - 34.0 pg   MCHC 36.2 (H) 30.0 - 36.0 g/dL   RDW 12.5 11.5 - 15.5 %   Platelets 281 150 - 400 K/uL   Neutrophils Relative % 75 %   Neutro Abs 7.7 1.7 - 7.7 K/uL   Lymphocytes Relative 9 %   Lymphs Abs 0.9 0.7 - 4.0 K/uL   Monocytes Relative 14 %   Monocytes Absolute 1.4 (H) 0.1 - 1.0 K/uL   Eosinophils Relative 2 %   Eosinophils Absolute 0.2 0.0 - 0.7 K/uL   Basophils Relative 0 %   Basophils Absolute 0.0 0.0 - 0.1 K/uL  Comprehensive metabolic panel     Status: Abnormal   Collection Time: 06/05/17 12:18 PM  Result Value Ref Range   Sodium 131 (L)  135 - 145 mmol/L   Potassium 3.3 (L) 3.5 - 5.1 mmol/L   Chloride 91 (L) 101 - 111 mmol/L   CO2 29 22 - 32 mmol/L   Glucose, Bld 104 (H) 65 - 99 mg/dL   BUN 19 6 - 20 mg/dL   Creatinine, Ser 0.77 0.61 - 1.24 mg/dL   Calcium 8.9 8.9 - 10.3 mg/dL   Total Protein 7.3 6.5 - 8.1 g/dL   Albumin 3.4 (L) 3.5 - 5.0 g/dL   AST 23 15 - 41 U/L   ALT 21 17 - 63 U/L   Alkaline Phosphatase 54 38 - 126 U/L   Total Bilirubin 1.1 0.3 - 1.2 mg/dL   GFR calc non Af Amer >60 >60 mL/min   GFR calc Af Amer >60 >60 mL/min    Comment: (NOTE) The eGFR has been calculated using the CKD EPI equation. This calculation has not been validated in all clinical situations. eGFR's persistently <60 mL/min signify possible Chronic Kidney Disease.    Anion gap 11 5 - 15  Lipase, blood     Status: None   Collection Time: 06/05/17 12:18 PM  Result Value Ref Range   Lipase  19 11 - 51 U/L   Ct Abdomen Pelvis W Contrast  Result Date: 06/05/2017 CLINICAL DATA:  Upper abdominal pain. EXAM: CT ABDOMEN AND PELVIS WITH CONTRAST TECHNIQUE: Multidetector CT imaging of the abdomen and pelvis was performed using the standard protocol following bolus administration of intravenous contrast. CONTRAST:  100 mL Isovue-300 COMPARISON:  None. FINDINGS: Lower chest: No acute abnormality. Hepatobiliary: No focal liver abnormality is seen. No gallstones, gallbladder wall thickening, or biliary dilatation. Pancreas: Unremarkable. No pancreatic ductal dilatation or surrounding inflammatory changes. Spleen: Normal in size without focal abnormality. Adrenals/Urinary Tract: Adrenal glands are unremarkable. Kidneys are normal, without renal calculi, focal lesion, or hydronephrosis. Bladder is unremarkable. Stomach/Bowel: Severe small bowel dilatation with a relative transition zone in the lower pelvis where there is bowel wall thickening. Small volume pneumoperitoneum most consistent with bowel perforation. Sigmoid diverticulosis without evidence of diverticulitis. No abdominal or pelvic free fluid. Vascular/Lymphatic: Mild abdominal aortic atherosclerosis. No lymphadenopathy. Reproductive: Prostate is unremarkable. Other: No abdominal wall hernia or abnormality. No abdominopelvic ascites. Musculoskeletal: No acute osseous abnormality. No lytic or sclerotic osseous lesion. IMPRESSION: 1. Small-bowel obstruction with pneumoperitoneum concerning for bowel perforation. Relative transition zone in the lower pelvis. Critical Value/emergent results were called by telephone at the time of interpretation on 06/05/2017 at 2:36 pm to Dr. Jola Schmidt , who verbally acknowledged these results. Electronically Signed   By: Kathreen Devoid   On: 06/05/2017 14:38    Review of Systems  Constitutional: Positive for weight loss. Negative for chills, diaphoresis, fever and malaise/fatigue.  HENT: Negative.   Eyes: Negative.    Respiratory: Negative.   Cardiovascular: Negative.   Gastrointestinal: Positive for abdominal pain, nausea and vomiting. Negative for blood in stool, constipation, diarrhea (He had some loose stools to start), heartburn and melena.  Genitourinary: Negative.   Musculoskeletal: Negative.   Skin: Negative.   Neurological: Negative.  Negative for weakness.  Endo/Heme/Allergies: Negative.   Psychiatric/Behavioral: Negative.     Blood pressure 118/83, pulse 61, temperature 98.9 F (37.2 C), temperature source Oral, resp. rate 20, height _0  (1.753 m), weight 54.4 kg (120 lb), SpO2 99 %. Physical Exam  Constitutional: He is oriented to person, place, and time. He appears well-developed and well-nourished. No distress.  HENT:  Head: Normocephalic and atraumatic.  Mouth/Throat: No oropharyngeal exudate.  Eyes: Right eye exhibits no discharge. Left eye exhibits no discharge. No scleral icterus.  Pupil are equal  Neck: Normal range of motion. Neck supple. No JVD present. No tracheal deviation present. No thyromegaly present.  Cardiovascular: Normal rate, regular rhythm, normal heart sounds and intact distal pulses.   No murmur heard. Respiratory: Effort normal and breath sounds normal. No respiratory distress. He has no wheezes. He has no rales. He exhibits no tenderness.  GI: He exhibits distension. He exhibits no mass. There is tenderness. There is rebound. There is no guarding.  Few high pitched bowel sounds  Musculoskeletal: He exhibits no edema or tenderness.  Lymphadenopathy:    He has no cervical adenopathy.  Neurological: He is alert and oriented to person, place, and time. A cranial nerve deficit is present.  Skin: Skin is warm and dry. No rash noted. He is not diaphoretic. No erythema. No pallor.  Psychiatric: He has a normal mood and affect. His behavior is normal. Judgment and thought content normal.     Assessment/Plan SBO with free air Small bowel vs colon  perforation Tobacco use  Plan:  Exploratory laparoscopy, exploratory laparotomy, possible bowel resection for perforation.  IV fluids, Zosyn, further Rx as needed.   JENNINGS,WILLARD, PA-C 06/05/2017, 3:21 PM   Agree with above. Reviewed CT scan and the CT scan report with Dr. Jearld Lesch.  He does not see any evidence of diverticulitis, though he has diverticula.  The free air is most easily seen around the liver. However, putting this together, I suspect at diverticular perforation with subsequent partial SBO.  The patient is keeping some liquids down.   His only prior abdominal surgery was a right inguinal hernia as an infant.  He looks surprisingly good. Though this could be treated with antibiotics and see how he does, I think he is probably best served with an exploratory lap to figure out what is going on in his pelvis.  Dr. Excell Seltzer is on call tonight and I have discussed the case with him.  Alphonsa Overall, MD, [facility] Pager: [phone] Office phone:  [phone]

## 2017-06-05 NOTE — Progress Notes (Signed)
I have interviewed and examined the patient and reviewed the CT scan.  He clearly has a high-grade small bowel obstruction with a small amount of free peritoneal air indicating a perforation. It is unclear whether he has a small bowel perforation or possibly perforated diverticulitis and subsequent small bowel obstruction.  On exam his abdomen is distended with lower abdominal tenderness and some guarding. He does not appear severely ill.  I discussed the findings in detail with the patient and his wife. I discussed possible diagnoses and alternative treatments. As the diagnosis is not completely clear and he does have a perforation I believe the best course would be laparotomy. I discussed the alternative of bowel rest and antibiotics but if he has a small bowel perforation this is unlikely to be successful. After discussion they would like to proceed with laparotomy which I believe is the safest choice. This is been discussed with Dr. Ezzard StandingNewman as well who agrees. I discussed the surgery in detail including its nature and expected recovery, possible need for temporary colostomy and risks of anesthetic complications, bleeding or infection. All their questions were answered.

## 2017-06-05 NOTE — Anesthesia Preprocedure Evaluation (Addendum)
Anesthesia Evaluation  Patient identified by MRN, date of birth, ID band Patient awake    Reviewed: Allergy & Precautions, NPO status , Patient's Chart, lab work & pertinent test results  Airway Mallampati: III  TM Distance: >3 FB Neck ROM: Full    Dental no notable dental hx.    Pulmonary neg pulmonary ROS, Current Smoker,    Pulmonary exam normal breath sounds clear to auscultation       Cardiovascular negative cardio ROS Normal cardiovascular exam Rhythm:Regular Rate:Normal     Neuro/Psych negative neurological ROS  negative psych ROS   GI/Hepatic negative GI ROS, Neg liver ROS,   Endo/Other  negative endocrine ROS  Renal/GU negative Renal ROS  negative genitourinary   Musculoskeletal negative musculoskeletal ROS (+)   Abdominal   Peds negative pediatric ROS (+)  Hematology negative hematology ROS (+)   Anesthesia Other Findings   Reproductive/Obstetrics negative OB ROS                            Anesthesia Physical Anesthesia Plan  ASA: II and emergent  Anesthesia Plan: General   Post-op Pain Management:    Induction: Intravenous  PONV Risk Score and Plan: 1 and Ondansetron, Dexamethasone, Propofol and Midazolam  Airway Management Planned: Oral ETT  Additional Equipment:   Intra-op Plan:   Post-operative Plan: Extubation in OR  Informed Consent: I have reviewed the patients History and Physical, chart, labs and discussed the procedure including the risks, benefits and alternatives for the proposed anesthesia with the patient or authorized representative who has indicated his/her understanding and acceptance.   Dental advisory given  Plan Discussed with: CRNA  Anesthesia Plan Comments:         Anesthesia Quick Evaluation

## 2017-06-05 NOTE — Transfer of Care (Signed)
Immediate Anesthesia Transfer of Care Note  Patient: Colton Stanley  Procedure(s) Performed: Procedure(s): exploratory laparotomy, sigmoid colon resection, colostomy (N/A)  Patient Location: PACU  Anesthesia Type:General  Level of Consciousness: awake, alert , oriented and patient cooperative  Airway & Oxygen Therapy: Patient Spontanous Breathing and Patient connected to face mask oxygen  Post-op Assessment: Report given to RN, Post -op Vital signs reviewed and stable and Patient moving all extremities X 4  Post vital signs: stable  Last Vitals:  Vitals:   06/05/17 1700 06/05/17 1730  BP: 115/80 108/77  Pulse: 64 65  Resp:    Temp:      Last Pain:  Vitals:   06/05/17 1437  TempSrc:   PainSc: 4       Patients Stated Pain Goal: 4 (06/05/17 1300)  Complications: No apparent anesthesia complications

## 2017-06-05 NOTE — Anesthesia Procedure Notes (Signed)
Procedure Name: Intubation Date/Time: 06/05/2017 7:19 PM Performed by: Lissa Morales Pre-anesthesia Checklist: Patient identified, Emergency Drugs available, Suction available and Patient being monitored Patient Re-evaluated:Patient Re-evaluated prior to inductionOxygen Delivery Method: Circle system utilized Preoxygenation: Pre-oxygenation with 100% oxygen Intubation Type: IV induction Ventilation: Mask ventilation without difficulty Laryngoscope Size: Mac and 4 Grade View: Grade II Tube type: Oral Tube size: 7.5 mm Number of attempts: 1 Airway Equipment and Method: Stylet and Oral airway (RSI with cricoid) Placement Confirmation: ETT inserted through vocal cords under direct vision,  positive ETCO2 and breath sounds checked- equal and bilateral Secured at: 21 cm Tube secured with: Tape Dental Injury: Teeth and Oropharynx as per pre-operative assessment

## 2017-06-06 ENCOUNTER — Encounter (HOSPITAL_COMMUNITY): Payer: Self-pay | Admitting: General Surgery

## 2017-06-06 LAB — CBC
HEMATOCRIT: 39.8 % (ref 39.0–52.0)
HEMOGLOBIN: 13.8 g/dL (ref 13.0–17.0)
MCH: 30.3 pg (ref 26.0–34.0)
MCHC: 34.7 g/dL (ref 30.0–36.0)
MCV: 87.3 fL (ref 78.0–100.0)
Platelets: 296 10*3/uL (ref 150–400)
RBC: 4.56 MIL/uL (ref 4.22–5.81)
RDW: 12.6 % (ref 11.5–15.5)
WBC: 10 10*3/uL (ref 4.0–10.5)

## 2017-06-06 LAB — COMPREHENSIVE METABOLIC PANEL
ALK PHOS: 43 U/L (ref 38–126)
ALT: 17 U/L (ref 17–63)
ANION GAP: 8 (ref 5–15)
AST: 21 U/L (ref 15–41)
Albumin: 2.6 g/dL — ABNORMAL LOW (ref 3.5–5.0)
BUN: 15 mg/dL (ref 6–20)
CALCIUM: 7.7 mg/dL — AB (ref 8.9–10.3)
CO2: 24 mmol/L (ref 22–32)
CREATININE: 0.7 mg/dL (ref 0.61–1.24)
Chloride: 102 mmol/L (ref 101–111)
Glucose, Bld: 110 mg/dL — ABNORMAL HIGH (ref 65–99)
Potassium: 4.2 mmol/L (ref 3.5–5.1)
SODIUM: 134 mmol/L — AB (ref 135–145)
Total Bilirubin: 1 mg/dL (ref 0.3–1.2)
Total Protein: 5.5 g/dL — ABNORMAL LOW (ref 6.5–8.1)

## 2017-06-06 LAB — MAGNESIUM: MAGNESIUM: 1.9 mg/dL (ref 1.7–2.4)

## 2017-06-06 LAB — HIV ANTIBODY (ROUTINE TESTING W REFLEX): HIV SCREEN 4TH GENERATION: NONREACTIVE

## 2017-06-06 MED ORDER — METHOCARBAMOL 1000 MG/10ML IJ SOLN
1000.0000 mg | Freq: Three times a day (TID) | INTRAVENOUS | Status: AC
  Administered 2017-06-06 – 2017-06-08 (×6): 1000 mg via INTRAVENOUS
  Filled 2017-06-06 (×6): qty 10

## 2017-06-06 MED ORDER — KCL IN DEXTROSE-NACL 20-5-0.9 MEQ/L-%-% IV SOLN
INTRAVENOUS | Status: DC
  Administered 2017-06-06 – 2017-06-08 (×9): via INTRAVENOUS
  Administered 2017-06-09: 75 mL/h via INTRAVENOUS
  Administered 2017-06-09 – 2017-06-10 (×2): via INTRAVENOUS
  Administered 2017-06-10: 1000 mL via INTRAVENOUS
  Filled 2017-06-06 (×17): qty 1000

## 2017-06-06 MED ORDER — ACETAMINOPHEN 10 MG/ML IV SOLN
1000.0000 mg | Freq: Four times a day (QID) | INTRAVENOUS | Status: AC
  Administered 2017-06-06 – 2017-06-07 (×4): 1000 mg via INTRAVENOUS
  Filled 2017-06-06 (×5): qty 100

## 2017-06-06 MED ORDER — ENOXAPARIN SODIUM 40 MG/0.4ML ~~LOC~~ SOLN
40.0000 mg | SUBCUTANEOUS | Status: DC
  Administered 2017-06-06 – 2017-06-10 (×5): 40 mg via SUBCUTANEOUS
  Filled 2017-06-06 (×5): qty 0.4

## 2017-06-06 MED ORDER — PHENOL 1.4 % MT LIQD
1.0000 | OROMUCOSAL | Status: DC | PRN
  Filled 2017-06-06: qty 177

## 2017-06-06 NOTE — Progress Notes (Addendum)
1 Day Post-Op    CC: Abdominal pain, nausea and vomiting.  Subjective: He looks pretty good, NG sump changed and irrigated the tube.  Not much coming from the NG.  Ostomy looks good and he seems to be handling things well.  Objective: Vital signs in last 24 hours: Temp:  [98.1 F (36.7 C)-99.1 F (37.3 C)] 99.1 F (37.3 C) (06/19 0521) Pulse Rate:  [61-99] 70 (06/19 0521) Resp:  [14-20] 18 (06/19 0521) BP: (107-125)/(71-83) 125/80 (06/19 0521) SpO2:  [95 %-100 %] 95 % (06/19 0521) Weight:  [54.4 kg (120 lb)] 54.4 kg (120 lb) (06/18 1300)  5191 IV 575 urine recorded NG 50 Stool  0 Afebrile, VSS Labs OK CT 06/05/17 Intake/Output from previous day: 06/18 0701 - 06/19 0700 In: 5191.7 [I.V.:3091.7; IV Piggyback:2100] Out: 875 [Urine:575; Emesis/NG output:50; Blood:200] Intake/Output this shift: No intake/output data recorded.  General appearance: alert, cooperative, no distress and ongoing discomfort Resp: clear to auscultation bilaterally GI: open wound looks good, some bloody sweat in the ostomy bag.  Ostomy looks good.  No BS  Lab Results:   Recent Labs  06/05/17 1218 06/06/17 0508  WBC 10.2 10.0  HGB 15.8 13.8  HCT 43.7 39.8  PLT 281 296    BMET  Recent Labs  06/05/17 1218 06/06/17 0508  NA 131* 134*  K 3.3* 4.2  CL 91* 102  CO2 29 24  GLUCOSE 104* 110*  BUN 19 15  CREATININE 0.77 0.70  CALCIUM 8.9 7.7*   PT/INR No results for input(s): LABPROT, INR in the last 72 hours.   Recent Labs Lab 06/05/17 1218 06/06/17 0508  AST 23 21  ALT 21 17  ALKPHOS 54 43  BILITOT 1.1 1.0  PROT 7.3 5.5*  ALBUMIN 3.4* 2.6*     Lipase     Component Value Date/Time   LIPASE 19 06/05/2017 1218     Medications: . HYDROmorphone      . HYDROmorphone      . nicotine  14 mg Transdermal Daily  . pantoprazole (PROTONIX) IV  40 mg Intravenous Q12H   . 0.9 % NaCl with KCl 40 mEq / L 125 mL/hr (06/06/17 0810)  . piperacillin-tazobactam (ZOSYN)  IV 3.375 g  (06/06/17 0551)   Anti-infectives    Start     Dose/Rate Route Frequency Ordered Stop   06/05/17 2200  piperacillin-tazobactam (ZOSYN) IVPB 3.375 g     3.375 g 12.5 mL/hr over 240 Minutes Intravenous Every 8 hours 06/05/17 1608     06/05/17 1345  piperacillin-tazobactam (ZOSYN) IVPB 3.375 g     3.375 g 100 mL/hr over 30 Minutes Intravenous  Once 06/05/17 1335 06/05/17 1553      Assessment/Plan Pelvic abscess and small bowel obstruction secondary to perforated sigmoid diverticulitis with abscess S/p exploratory laparotomy, sigmoid colon resection, colostomy  (Hartmann colectomy), 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 Glenna FellowsBenjamin Hoxworth  POD 1 Tobacco use FEN: IV fluids/NPO ID: Zosyn 06/05/17 =>> Day 2 DVT:  SCD - add Lovenox tonight  Plan:  Ice chips, IV Tylenol and Robaxin.  Mobilize some today.  Continue antibiotics.  Decrease K+ in IV fluids.    LOS: 1 day    JENNINGS,WILLARD 06/06/2017 [phone]  Agree with above. Did not look very sick before surgery and looks okay now. I answered questions about length of hospitalization and ostomy reversal.   Will D/C foley. I increased fluid a little bit.  His wife is in the room.  [place] talked [place]  Onalee Huaavid  Lucia Gaskins, MD, [facility] Surgery Pager: [phone] Office phone:  [phone]

## 2017-06-06 NOTE — Consult Note (Signed)
WOC Nurse ostomy consult note Stoma type/location: New colostomy, created emergently by Dr. Johna SheriffHoxworth on 06/05/17.  Stomal assessment/size: 1 and 3/8 inches round, edematous, moist and budded.  Os pointing downward at 6 o'clock Peristomal assessment: ostomy is in a crease at 3 and 9 o'clock Treatment options for stomal/peristomal skin: skin barrier ring and convex pouch applied today.  May require a f;at pouch to accommodate crease. Output: scant serosanguinous drainage today. Ostomy pouching: 1pc.convex ostomy pouching system with skin barrier ring used to provide a flat pouching surface. Education provided: Patient and wife present for first post op education session, patient is groggy and has just been administered pain medication.  OOB in chair. GI taught, also pouch and stoma characteristics. An education booklet is provided to the bedside. I introduced the concept of a HHRN post discharge for continued ostomy education and teaching and they are amenable. If you agree, please order, consult with Case Management to arrange. Plan to see tomorrow. I will have to experiment with different ostomy pouching systems to see which will best match the contour of his abdomen. Enrolled patient in [employer] program: No WOC nursing team will follow, and will remain available to this patient, the nursing and medical teams.  Thanks, Ladona MowLaurie Lui Bellis, MSN, RN, GNP, Hans EdenCWOCN, CWON-AP, FAAN  Pager# [phone]

## 2017-06-07 LAB — AEROBIC/ANAEROBIC CULTURE (SURGICAL/DEEP WOUND)

## 2017-06-07 LAB — BASIC METABOLIC PANEL
ANION GAP: 7 (ref 5–15)
BUN: 10 mg/dL (ref 6–20)
CO2: 24 mmol/L (ref 22–32)
Calcium: 7.5 mg/dL — ABNORMAL LOW (ref 8.9–10.3)
Chloride: 105 mmol/L (ref 101–111)
Creatinine, Ser: 0.6 mg/dL — ABNORMAL LOW (ref 0.61–1.24)
Glucose, Bld: 134 mg/dL — ABNORMAL HIGH (ref 65–99)
POTASSIUM: 3.8 mmol/L (ref 3.5–5.1)
SODIUM: 136 mmol/L (ref 135–145)

## 2017-06-07 LAB — AEROBIC/ANAEROBIC CULTURE W GRAM STAIN (SURGICAL/DEEP WOUND)

## 2017-06-07 MED ORDER — SODIUM CHLORIDE 0.9 % IV SOLN
3.0000 g | Freq: Four times a day (QID) | INTRAVENOUS | Status: DC
  Administered 2017-06-07 – 2017-06-11 (×16): 3 g via INTRAVENOUS
  Filled 2017-06-07 (×19): qty 3

## 2017-06-07 MED ORDER — ACETAMINOPHEN 10 MG/ML IV SOLN
1000.0000 mg | Freq: Four times a day (QID) | INTRAVENOUS | Status: AC
  Administered 2017-06-07 (×3): 1000 mg via INTRAVENOUS
  Filled 2017-06-07 (×6): qty 100

## 2017-06-07 MED ORDER — HYDROMORPHONE HCL 1 MG/ML IJ SOLN
0.7500 mg | INTRAMUSCULAR | Status: DC | PRN
  Administered 2017-06-07 – 2017-06-10 (×18): 1 mg via INTRAVENOUS
  Filled 2017-06-07 (×19): qty 1

## 2017-06-07 MED ORDER — HYDROMORPHONE HCL 1 MG/ML IJ SOLN: 0.7500 mg | INTRAMUSCULAR | Status: DC | PRN

## 2017-06-07 NOTE — Progress Notes (Signed)
Rt gave pt flutter valve per MD order. Pt knows and understands how to do.  

## 2017-06-07 NOTE — Consult Note (Signed)
WOC Nurse ostomy follow up Stoma type/location: LLQ colostomy 1 and 3/8 inches round, moist in a crease Stomal assessment/size: See above Peristomal assessment: NOt seen today.  Pouch applied yesterday is intact. Treatment options for stomal/peristomal skin: skin barrier ring Output: small amount of light brown liquid stool Ostomy pouching: 1pc.convex with skin barrier ring  Education provided: Patient and wife taught to empty pouch and clean tail closure using toilet paper wicks. Demonstration provided, wife participating in session.  Walking in halls.  Will see tomorrow for pouch change. [place] [employer] [place] program: No \\WOC  nursing team will  follow, and will remain available to this patient, the nursing and medical teams.   Thanks, Ladona MowLaurie Oumar Marcott, MSN, RN, GNP, [employer] EdenCWOCN, CWON-AP, FAAN  Pager# [phone]

## 2017-06-07 NOTE — Progress Notes (Signed)
Discharge planning, spoke with patient and spouse at beside. Chose [facility] for HH services, contacted [facility] for referral. [phone] 

## 2017-06-07 NOTE — Progress Notes (Addendum)
[facility] Progress Note  2 Days Post-Op  Subjective: CC: abdominal pain, nausea and vomiting Having more pain today than yesterday.  There is a little stool colored fluid in the ostomy this AM.  Open wound looks good.    Objective: Vital signs in last 24 hours: Temp:  [98.6 F (37 C)-99.2 F (37.3 C)] 99.2 F (37.3 C) (06/20 0536) Pulse Rate:  [65-77] 77 (06/20 0536) Resp:  [18] 18 (06/20 0536) BP: (107-125)/(63-80) 117/66 (06/20 0536) SpO2:  [90 %-99 %] 92 % (06/20 0536) Last BM Date: 06/02/17  160 PO/NPO except ice 3000 IV Urine 850 recorded NG 625 No stool Afebrile, VSS Labs OK  Intake/Output from previous day: 06/19 0701 - 06/20 0700 In: 3303.3 [P.O.:160; I.V.:2733.3; IV Piggyback:410] Out: 1475 [Urine:850; Emesis/NG output:625] Intake/Output this shift: No intake/output data recorded.  PE: Gen:  Alert, NAD, pleasant Card:  Regular rate and rhythm, pedal pulses 2+ BL Pulm:  Normal effort, it hurts to cough, some upper airway congestion,  clear to auscultation bilaterally Abd: Soft, non-tender, non-distended, bowel sounds present in all 4 quadrants, no HSM, incisions C/D/I Skin: warm and dry, no rashes  Psych: A&Ox3   Lab Results:   Recent Labs  06/05/17 1218 06/06/17 0508  WBC 10.2 10.0  HGB 15.8 13.8  HCT 43.7 39.8  PLT 281 296   BMET  Recent Labs  06/06/17 0508 06/07/17 0533  NA 134* 136  K 4.2 3.8  CL 102 105  CO2 24 24  GLUCOSE 110* 134*  BUN 15 10  CREATININE 0.70 0.60*  CALCIUM 7.7* 7.5*   CMP     Component Value Date/Time   NA 136 06/07/2017 0533   K 3.8 06/07/2017 0533   CL 105 06/07/2017 0533   CO2 24 06/07/2017 0533   GLUCOSE 134 (H) 06/07/2017 0533   BUN 10 06/07/2017 0533   CREATININE 0.60 (L) 06/07/2017 0533   CALCIUM 7.5 (L) 06/07/2017 0533   PROT 5.5 (L) 06/06/2017 0508   ALBUMIN 2.6 (L) 06/06/2017 0508   AST 21 06/06/2017 0508   ALT 17 06/06/2017 0508   ALKPHOS 43 06/06/2017 0508   BILITOT 1.0  06/06/2017 0508   GFRNONAA >60 06/07/2017 0533   GFRAA >60 06/07/2017 0533   Lipase     Component Value Date/Time   LIPASE 19 06/05/2017 1218    Studies/Results: Ct Abdomen Pelvis W Contrast  Result Date: 06/05/2017 CLINICAL DATA:  Upper abdominal pain. EXAM: CT ABDOMEN AND PELVIS WITH CONTRAST TECHNIQUE: Multidetector CT imaging of the abdomen and pelvis was performed using the standard protocol following bolus administration of intravenous contrast. CONTRAST:  100 mL Isovue-300 COMPARISON:  None. FINDINGS: Lower chest: No acute abnormality. Hepatobiliary: No focal liver abnormality is seen. No gallstones, gallbladder wall thickening, or biliary dilatation. Pancreas: Unremarkable. No pancreatic ductal dilatation or surrounding inflammatory changes. Spleen: Normal in size without focal abnormality. Adrenals/Urinary Tract: Adrenal glands are unremarkable. Kidneys are normal, without renal calculi, focal lesion, or hydronephrosis. Bladder is unremarkable. Stomach/Bowel: Severe small bowel dilatation with a relative transition zone in the lower pelvis where there is bowel wall thickening. Small volume pneumoperitoneum most consistent with bowel perforation. Sigmoid diverticulosis without evidence of diverticulitis. No abdominal or pelvic free fluid. Vascular/Lymphatic: Mild abdominal aortic atherosclerosis. No lymphadenopathy. Reproductive: Prostate is unremarkable. Other: No abdominal wall hernia or abnormality. No abdominopelvic ascites. Musculoskeletal: No acute osseous abnormality. No lytic or sclerotic osseous lesion. IMPRESSION: 1. Small-bowel obstruction with pneumoperitoneum concerning for bowel perforation. Relative transition zone in the  lower pelvis. Critical Value/emergent results were called by telephone at the time of interpretation on 06/05/2017 at 2:36 pm to Dr. Azalia Bilis , who verbally acknowledged these results. Electronically Signed   By: Elige Ko   On: 06/05/2017 14:38     Anti-infectives: Anti-infectives    Start     Dose/Rate Route Frequency Ordered Stop   06/05/17 2200  piperacillin-tazobactam (ZOSYN) IVPB 3.375 g     3.375 g 12.5 mL/hr over 240 Minutes Intravenous Every 8 hours 06/05/17 1608     06/05/17 1345  piperacillin-tazobactam (ZOSYN) IVPB 3.375 g     3.375 g 100 mL/hr over 30 Minutes Intravenous  Once 06/05/17 1335 06/05/17 1553     . acetaminophen    . dextrose 5 % and 0.9 % NaCl with KCl 20 mEq/L 150 mL/hr at 06/07/17 0826  . methocarbamol (ROBAXIN)  IV Stopped (06/07/17 0556)  . piperacillin-tazobactam (ZOSYN)  IV Stopped (06/07/17 0934)    Assessment/Plan Pelvic abscess and SBO secondary to perforated sigmoid diverticulitis S/p exploratory laparotomy, sigmoid colon resection, Hartman's - 06/05/17 Dr. Johna Sheriff - POD#2 - NGT 625 cc in last 24 hr Tobacco use - swears he is done with tobacco  FEN -  VTE - lovenox, SCDs ID - IV Zosyn (6/18 =>> day 3  Plan:  Plan:  Continue antibiotics, IV fluids.     I'm not sure if the recorded volume is incorrect or if he is still not putting our allot of urine.  Watch BMP    Clamp NG and see how he does, may be able to remove later today if he does well. Add Flutter valve.      LOS: 2 days    Wells Guiles , [facility] 06/07/2017, 8:20 AM Pager: [phone] Consults: [phone]  Agree with above. Doing well.  Ovidio Kin, MD, [facility] Pager: [phone] Office phone:  [phone]

## 2017-06-08 MED ORDER — IBUPROFEN 200 MG PO TABS: 600.0000 mg | ORAL_TABLET | Freq: Four times a day (QID) | ORAL | Status: DC | PRN

## 2017-06-08 MED ORDER — HYDROCODONE-ACETAMINOPHEN 5-325 MG PO TABS
1.0000 | ORAL_TABLET | ORAL | Status: DC | PRN
  Administered 2017-06-09 – 2017-06-11 (×9): 2 via ORAL
  Filled 2017-06-08 (×11): qty 2

## 2017-06-08 NOTE — Progress Notes (Signed)
Initial Nutrition Assessment  DOCUMENTATION CODES:   Severe malnutrition in context of acute illness/injury, Underweight  INTERVENTION:   Diet advancement per MD Will provide nutritional supplements upon diet advancement RD to continue to monitor for plan  NUTRITION DIAGNOSIS:   Malnutrition (severe) related to acute illness (SBO, pelvix abscess) as evidenced by percent weight loss, energy intake < or equal to 50% for > or equal to 1 month, moderate depletion of body fat, severe depletion of muscle mass.  GOAL:   Patient will meet greater than or equal to 90% of their needs  MONITOR:   Diet advancement, Labs, Weight trends, I & O's  REASON FOR ASSESSMENT:    (Low BMI: 17.8)    ASSESSMENT:   51 y.o. who reported 5 days of pain, and pain in all 4 quadrants, 8/10 severity.  He [facility] at [facility] and treated with Imodium, Zofran,  and Bentyl on 05/31/17.    He tried to drink some Gatorade but promptly vomited it up.  He has not gotten better, with ongoing pain for the last 5 days.  He returned to an [facility]gent [facility] this AM and was sent to the ED here.   He continues to complain of lower abdominal pain after eating, he has also had some loose stools starting early AM last Wed, 6/13, he has not really been able to eat anything since that time, but has kept some liquids down.  .   6/18: exploratory laparotomy, sigmoid colon resection, Hartman's   Patient reports NGT was removed today and he is now sipping on water and ice chips. Plan per surgery, is to advance to clears as long as he tolerates water today. Pt denies any soreness in his throat from the tube. Pt states he last consumed solid food on Sunday 6/17 (chicken wraps) and he was unable to tolerate anything else PO after that. Pt went a few days on a liquid diet prior. He is agreeable to receiving supplements once diet is advanced.  Per patient, UBW is 130 lb. States he has been smaller in frame for years now. Nutrition-Focused  physical exam completed. Findings are moderate fat depletion, severe muscle depletion, and no edema.   Labs reviewed. Medications: IV Protonix BID, D5 and .9% NaCl w/ KCl infusion at 150 ml/hr -provides 612 kcal  Diet Order:  Diet NPO time specified Except for: Ice Chips, Other (See Comments), Sips with Meds Diet clear liquid Room service appropriate? Yes; Fluid consistency: Thin  Skin:  Reviewed, no issues  Last BM:  6/15  Height:   Ht Readings from Last 1 Encounters:  06/05/17 5\' 9"  (1.753 m)    Weight:   Wt Readings from Last 1 Encounters:  06/05/17 120 lb (54.4 kg)    Ideal Body Weight:  72.7 kg  BMI:  Body mass index is 17.72 kg/m.  Estimated Nutritional Needs:   Kcal:  1650-1850  Protein:  75-85g  Fluid:  1.8L/day  EDUCATION NEEDS:   Education needs addressed  Tilda FrancoLindsey Rayola Everhart, MS, RD, LDN Pager: [phone] After Hours Pager: [phone]

## 2017-06-08 NOTE — Progress Notes (Addendum)
3 Days Post-Op    CC:  abdominal pain, nausea and vomiting  Subjective: Sounds very congested, afraid to remove NG yesterday, hurts to cough with NG.  NG clamped all day, no nausea, some liquid stool in ostomy bag.  He was just back from the second walk, and up in the chair so I did not take down his wound down.  I did pull his NG.  Objective: Vital signs in last 24 hours: Temp:  [98.8 F (37.1 C)-99 F (37.2 C)] 99 F (37.2 C) (06/21 0539) Pulse Rate:  [72-78] 72 (06/21 0539) Resp:  [16-18] 16 (06/21 0539) BP: (107-127)/(61-81) 127/61 (06/21 0539) SpO2:  [92 %-97 %] 97 % (06/21 0539) Last BM Date: 06/02/17 NPO 4400 IV 1250 urine 350 from NG - NG clamped all day 50 ml from the ostomy Afebrile, VSS Labs OK No films Intake/Output from previous day: 06/20 0701 - 06/21 0700 In: 4372.5 [I.V.:3552.5; NG/GT:60; IV Piggyback:760] Out: 1650 [Urine:1250; Emesis/NG output:350; Stool:50] Intake/Output this shift: No intake/output data recorded.  General appearance: alert, cooperative and no distress Resp: billateral ronchi, upper airway congestion. GI: No BS, wound dressing in place, looked fine yesterday.  Some liquid stool in ostomy bag, but not much.     Lab Results:   Recent Labs  06/05/17 1218 06/06/17 0508  WBC 10.2 10.0  HGB 15.8 13.8  HCT 43.7 39.8  PLT 281 296    BMET  Recent Labs  06/06/17 0508 06/07/17 0533  NA 134* 136  K 4.2 3.8  CL 102 105  CO2 24 24  GLUCOSE 110* 134*  BUN 15 10  CREATININE 0.70 0.60*  CALCIUM 7.7* 7.5*   PT/INR No results for input(s): LABPROT, INR in the last 72 hours.   Recent Labs Lab 06/05/17 1218 06/06/17 0508  AST 23 21  ALT 21 17  ALKPHOS 54 43  BILITOT 1.1 1.0  PROT 7.3 5.5*  ALBUMIN 3.4* 2.6*     Lipase     Component Value Date/Time   LIPASE 19 06/05/2017 1218     Medications: . enoxaparin (LOVENOX) injection  40 mg Subcutaneous Q24H  . nicotine  14 mg Transdermal Daily  . pantoprazole (PROTONIX)  IV  40 mg Intravenous Q12H    Assessment/Plan Pelvic abscess and SBO secondary to perforated sigmoid diverticulitis S/p exploratory laparotomy, sigmoid colon resection, Hartman's - 06/05/17 Dr. Johna Sheriff - POD#3 - NGT 625 cc in last 24 hr Tobacco use - swears he is done with tobacco  FEN -  VTE - lovenox, SCDs ID - IV Zosyn (6/18 =>> day 3  Plan:  Plan:  Continue antibiotics, IV fluids.                       I'm not sure if the recorded volume is incorrect or if he is still not putting our allot of urine.  Watch BMP               Clamp NG and see how he does, may be able to remove later today if he does well. Add Flutter valve.     Plan:  I pulled the NG, and his cough is already better.    Start him on sips and chips from the floor and if he does well go up to clear tray from downstairs.  Await bowel funciton.     LOS: 3 days    JENNINGS,WILLARD 06/08/2017 [phone]  Agree with above. Doing well so far. Taking small amounts of  liquids. Wife in room.  I added oral pain meds.  Ovidio Kinavid Iori Gigante, MD, [facility] Pager: [phone] Office phone:  [phone]

## 2017-06-09 MED ORDER — FAMOTIDINE 20 MG PO TABS
20.0000 mg | ORAL_TABLET | Freq: Two times a day (BID) | ORAL | Status: DC
  Administered 2017-06-09 – 2017-06-11 (×5): 20 mg via ORAL
  Filled 2017-06-09 (×5): qty 1

## 2017-06-09 MED ORDER — SACCHAROMYCES BOULARDII 250 MG PO CAPS
250.0000 mg | ORAL_CAPSULE | Freq: Two times a day (BID) | ORAL | Status: DC
Start: 2017-06-09 — End: 2017-06-11
  Administered 2017-06-09 – 2017-06-11 (×5): 250 mg via ORAL
  Filled 2017-06-09 (×5): qty 1

## 2017-06-09 NOTE — Progress Notes (Signed)
Patient and wife report a 10lb weight loss since this began day 1 prior to admission.

## 2017-06-09 NOTE — Progress Notes (Addendum)
4 Days Post-Op    ZO:[id number]:abdominal pain, nausea and vomiting   Subjective: He looks great, ostomy bag is literally full of liquid stool and gas, he has emptied it x 1 already this AM.  Open wound also looks good.  I have redressed it.    Objective: Vital signs in last 24 hours: Temp:  [99.1 F (37.3 C)-99.3 F (37.4 C)] 99.1 F (37.3 C) (06/22 0525) Pulse Rate:  [68-78] 68 (06/22 0525) Resp:  [16] 16 (06/22 0525) BP: (101-114)/(54-66) 114/66 (06/22 0525) SpO2:  [96 %-97 %] 96 % (06/22 0525) Last BM Date: 06/08/17 240 this AM recorded IV 3700 5760 urine 150 from ostomy this Am recorded Afebrile, VSS Labs OK  Intake/Output from previous day: 06/21 0701 - 06/22 0700 In: 3700 [I.V.:3600; IV Piggyback:100] Out: 5760 [Urine:5760] Intake/Output this shift: Total I/O In: 240 [P.O.:240] Out: 1200 [Urine:1050; Stool:150]  General appearance: alert, cooperative and no distress Resp: clear to auscultation bilaterally GI: soft, sore, I cant see the ostomy today it's covered.  Bag is full of liquid stool and ga.  Open wound looks very good, small tape burn already top of incision.  About .5 cm in diameter.   Lab Results:  No results for input(s): WBC, HGB, HCT, PLT in the last 72 hours.  BMET  Recent Labs  06/07/17 0533  NA 136  K 3.8  CL 105  CO2 24  GLUCOSE 134*  BUN 10  CREATININE 0.60*  CALCIUM 7.5*   PT/INR No results for input(s): LABPROT, INR in the last 72 hours.   Recent Labs Lab 06/05/17 1218 06/06/17 0508  AST 23 21  ALT 21 17  ALKPHOS 54 43  BILITOT 1.1 1.0  PROT 7.3 5.5*  ALBUMIN 3.4* 2.6*     Lipase     Component Value Date/Time   LIPASE 19 06/05/2017 1218     Medications: . enoxaparin (LOVENOX) injection  40 mg Subcutaneous Q24H  . nicotine  14 mg Transdermal Daily  . pantoprazole (PROTONIX) IV  40 mg Intravenous Q12H   . ampicillin-sulbactam (UNASYN) IV Stopped (06/09/17 0802)  . dextrose 5 % and 0.9 % NaCl with KCl 20 mEq/L 150  mL/hr at 06/09/17 0233   Anti-infectives    Start     Dose/Rate Route Frequency Ordered Stop   06/07/17 1400  Ampicillin-Sulbactam (UNASYN) 3 g in sodium chloride 0.9 % 100 mL IVPB     3 g 200 mL/hr over 30 Minutes Intravenous Every 6 hours 06/07/17 1345     06/05/17 2200  piperacillin-tazobactam (ZOSYN) IVPB 3.375 g  Status:  Discontinued     3.375 g 12.5 mL/hr over 240 Minutes Intravenous Every 8 hours 06/05/17 1608 06/07/17 1345   06/05/17 1345  piperacillin-tazobactam (ZOSYN) IVPB 3.375 g     3.375 g 100 mL/hr over 30 Minutes Intravenous  Once 06/05/17 1335 06/05/17 1553     . ampicillin-sulbactam (UNASYN) IV Stopped (06/09/17 0802)  . dextrose 5 % and 0.9 % NaCl with KCl 20 mEq/L 150 mL/hr at 06/09/17 [id number]    Assessment/Plan Pelvic abscess and SBO secondary to perforated sigmoid diverticulitis S/p exploratory laparotomy, sigmoid colon resection, Hartman's - 06/05/17 Dr. Johna SheriffHoxworth - POD#4 Tobacco use - swears he is done with tobacco FEN- IV fluids/ clears VTE- lovenox, SCDs ID- IV Zosyn (6/18 - 6/19;   Unasyn 6/20 =>> day 3   Plan:  Full liquids, nutrition consult, decrease IV fluids, add probiotic.    He has PO pain meds.   Case management  and home health requested.     LOS: 4 days    JENNINGS,WILLARD 06/09/2017 [id number]  Agree with above. Improving rapidly. Possibly home this weekend.  Ovidio Kin, MD, [facility] Surgery Pager: [phone] Office phone:  [phone]

## 2017-06-09 NOTE — Consult Note (Signed)
WOC Nurse ostomy follow up Stoma type/location: LLQ Colostomy Stomal assessment/size: 1 and 3/8 inches slightly oval, red, moist, diminishing edema Peristomal assessment: intact, clear Treatment options for stomal/peristomal skin: skin barrier ring (slightly flattened, convex (soft) pouch Output: thin brown stool Ostomy pouching: 1pc.convex pouch with skin barrier ring Education provided: Extended session with patient and wife regarding pouching equipment selected and pouch application.  Wife following along with step-by-step instruction page. Patient is independent in emptying and opening and closing pouch. Several other questions regarding ADLs and diet discussed.  RD consult was placed today as patient needs to gain weight, focus on protein intake and stamina prior to reconnection surgery. I changed patients midline dressing this afternoon. Tissue is pale pink, moist with small amount of serous drainage. En[place] in [employer] Discharge program: Yes WOC nursing team will follow, will remain available to this patient, the nursing and medical teams.  Please re-consult if needed. Thanks, Ladona MowLaurie Dessa Ledee, MSN, RN, GNP, Hans EdenCWOCN, CWON-AP, FAAN  Pager# [phone]

## 2017-06-09 NOTE — Progress Notes (Signed)
NUTRITION NOTE  Initial assessment note by RD yesterday at 12:31 PM with pt meeting criteria for severe malnutrition. Diet advanced from NPO to CLD yesterday at 4:00 PM and from CLD to FLD today at 11:39 AM. Pt consumed 40% of CLD tray for breakfast today. Consult from Surgery PA received today: Help him with protein supplements, doesn't take much to fill him. Will order Mighty Shake II BID; each supplement provides 500 kcal and 23 grams of protein.  Will continue to follow per protocol.    Trenton GammonJessica Nithya Meriweather, MS, RD, LDN, [facility] Inpatient Clinical Dietitian Pager # [phone] After hours/weekend pager # [phone]

## 2017-06-09 NOTE — Discharge Instructions (Signed)
[facility]      [facility] °[phone] ° °OPEN ABDOMINAL SURGERY: POST OP INSTRUCTIONS ° °Always review your discharge instruction sheet given to you by the facility where your surgery was performed. ° °IF YOU HAVE DISABILITY OR FAMILY LEAVE FORMS, YOU MUST BRING THEM TO THE OFFICE FOR PROCESSING.  PLEASE DO NOT GIVE THEM TO YOUR DOCTOR. ° °1. A prescription for pain medication may be given to you upon discharge.  Take your pain medication as prescribed, if needed.  If narcotic pain medicine is not needed, then you may take acetaminophen (Tylenol) or ibuprofen (Advil) as needed. °2. Take your usually prescribed medications unless otherwise directed. °3. If you need a refill on your pain medication, please contact your pharmacy. They will contact our office to request authorization.  Prescriptions will not be filled after 5pm or on week-ends. °4. You should follow a light diet the first few days after arrival home, such as soup and crackers, pudding, etc.unless your doctor has advised otherwise. A high-fiber, low fat diet can be resumed as tolerated.   Be sure to include lots of fluids daily. Most patients will experience some swelling and bruising on the chest and neck area.  Ice packs will help.  Swelling and bruising can take several days to resolve °5. Most patients will experience some swelling and bruising in the area of the incision. Ice pack will help. Swelling and bruising can take several days to resolve..  °6. It is common to experience some constipation if taking pain medication after surgery.  Increasing fluid intake and taking a stool softener will usually help or prevent this problem from occurring.  A mild laxative (Milk of Magnesia or Miralax) should be taken according to package directions if there are no bowel movements after 48 hours. °7.  You may have steri-strips (small skin tapes) in place directly over the incision.  These strips should be left on the skin for 7-10 days.  If your  surgeon used skin glue on the incision, you may shower in 24 hours.  The glue will flake off over the next 2-3 weeks.  Any sutures or staples will be removed at the office during your follow-up visit. You may find that a light gauze bandage over your incision may keep your staples from being rubbed or pulled. You may shower and replace the bandage daily. °8. ACTIVITIES:  You may resume regular (light) daily activities beginning the next day--such as daily self-care, walking, climbing stairs--gradually increasing activities as tolerated.  You may have sexual intercourse when it is comfortable.  Refrain from any heavy lifting or straining until approved by your doctor. °a. You may drive when you no longer are taking prescription pain medication, you can comfortably wear a seatbelt, and you can safely maneuver your car and apply brakes °b. Return to Work: ___________________________________ °9. You should see your doctor in the office for a follow-up appointment approximately two weeks after your surgery.  Make sure that you call for this appointment within a day or two after you arrive home to insure a convenient appointment time. °OTHER INSTRUCTIONS:  °_____________________________________________________________ °_____________________________________________________________ ° °WHEN TO CALL YOUR DOCTOR: °1. Fever over 101.0 °2. Inability to urinate °3. Nausea and/or vomiting °4. Extreme swelling or bruising °5. Continued bleeding from incision. °6. Increased pain, redness, or drainage from the incision. °7. Difficulty swallowing or breathing °8. Muscle cramping or spasms. °9. Numbness or tingling in hands or feet or around lips. ° °The clinic staff is available to   answer your questions during regular business hours.  Please dont hesitate to call and ask to speak to one of the nurses if you have concerns.  For further questions, please visit [email or web address]   Colostomy Home Guide, Adult A  colostomy is a surgical procedure to make an opening (stoma) for stool (feces) to leave your body. This surgery is done when a medical condition prevents stool from leaving your body through the end of the large intestine (rectum). During the surgery, part of the large intestine (colon) is attached to the stoma that is made in the front of your abdomen. A bag (pouch) is fitted over the stoma. Stool and gas will collect in the bag. After having this surgery, you will need to empty and change your colostomy bag as needed. You will also need to care for the stoma. How do I care for my stoma? Your stoma should look pink, red, and moist, like the inside of your cheek. At first, the stoma may be swollen, but this swelling will go away within 6 weeks. To care for the stoma:  Keep the skin around the stoma clean and dry.  Use a clean, soft washcloth to gently wash the stoma and the skin around it. ? Use warm water and only use cleansers recommended by your health care provider. ? Rinse the stoma area with plain water. ? Dry the area well.  Use stoma powder or ointment on your skin only as told by your health care provider. Do not use any other powders, gels, wipes, or creams on your skin.  Change your colostomy bag if your skin becomes irritated. Irritation may indicate that the bag is leaking.  Check your stoma area every day for signs of infection. Check for: ? More redness, swelling, or pain. ? More fluid or blood. ? Pus or warmth.  Measure the stoma opening regularly and record the size. Watch for changes. Share this information with your health care provider.  How do I care for my colostomy bag? The bag that fits over the stoma can have either one or two pieces.  One-piece bag: The skin barrier and the bag are combined in a single unit.  Two-piece bag: The skin barrier and the bag are separate pieces that attach to each other. 1.   Empty your bag at bedtime and whenever it is one-third  to one-half full. Do not let more stool or gas build up. This could cause the bag to leak. Some colostomy bags have a built-in gas release valve. Change the bag every 3-4 days or as told by your health care provider. Also change the bag if it is leaking or separating from the skin or your skin looks irritated. How do I empty my colostomy bag? Before you leave the hospital, you will be taught how to empty your bag. Follow these basic steps: 1. Wash your hands with soap and water. 2. Sit far back on the toilet. 3. Put several pieces of toilet paper into the toilet water. This will prevent splashing as you empty the stool into the toilet. 4. Remove the clip or the velcro from the tail end of the bag. 5. Unroll the tail, then empty stool into the toilet. 6. Clean the tail with toilet paper. 1.  7. Reroll the tail, and close it with the clip or velcro. 8. Wash your hands again.  How do I change my colostomy bag? Before you leave the hospital, you will be taught how to change your bag.  Always have colostomy supplies with you, and follow these basic steps: 1. Wash your hands with soap and water. Have paper towels or tissues near you to clean any discharge. 2. Use a template to pre-cut the skin barrier. Smooth any rough edges. 3. If using a two-piece bag, attach the bag and the skin barrier to each other. Add the barrier ring, if you use one. 4. If your stools are watery, add a few cotton balls to the new bag to absorb the liquid. 5. Remove the old bag and skin barrier. Gently push the skin away from the barrier with your fingers or a warm cloth. 6. Wash your hands again. Then clean the stoma area as directed with water or with mild soap and water. Use water to rinse away any soap. 7. Dry the skin. You may use the cool setting on a hair dryer to do this. 8. If directed, apply stoma powder or skin barrier gel to the skin. 9. Dry the skin again. 10. Warm the skin barrier with your hands or a warm  compress. 11. Remove the paper from the sticky (adhesive) strip of the skin barrier. 12. Press the adhesive strip onto the skin around the stoma. 13. Gently rub the skin barrier onto the skin. This creates heat that helps the barrier to stick. 14. Apply stoma tape to the edges of the skin barrier.  What are some general tips?  Avoid wearing clothes that are tight directly over your stoma.  You may shower or bathe with the colostomy bag on or off. Do not use harsh or oily soaps or lotions. Dry the skin and bag after bathing.  Store all supplies in a cool, dry place. Do not leave supplies in extreme heat because parts can melt.  Whenever you leave home, take an extra skin barrier and colostomy bag with you.  If your colostomy bag gets wet, you can dry it with a hair dryer on the cool setting.  To prevent odor, put drops of ostomy deodorizer in the colostomy bag. Your health care provider may also recommend putting ostomy lubricant inside the bag. This helps the stool to slide out of the bag more easily and completely. Contact a health care provider if:  You have more redness, swelling, or pain around your stoma.  You have more fluid or blood coming from your stoma.  Your stoma feels warm to the touch.  You have pus coming from your stoma.  Your stoma extends in or out farther than normal.  You need to change the bag every day.  You have a fever. Get help right away if:  Your stool is bloody.  You vomit.  You have trouble breathing. This information is not intended to replace advice given to you by your health care provider. Make sure you discuss any questions you have with your health care provider. Document Released: 12/08/2003 Document Revised: 04/14/2016 Document Reviewed: 04/09/2014 Elsevier Interactive Patient Education  2018 [employer]  Mechanical Wound Debridement Do dressing change twice a day.  You can shower with wound open. Mechanical wound debridement is  a treatment to remove dead tissue from a wound. This helps the wound heal. The treatment involves cleaning the wound (irrigation) and using a pad or gauze (dressing) to remove dead tissue and debris from the wound. There are different types of mechanical wound debridement. Depending on the wound, you may need to repeat this procedure or change to another form of debridement as your wound starts to heal.  Tell a health care provider about:  Any allergies you have.  All medicines you are taking, including vitamins, herbs, eye drops, creams, and over-the-counter medicines.  Any blood disorders you have.  Any medical conditions you have, including any conditions that: ? Cause a significant decrease in blood circulation to the part of the body where the wound is, such as peripheral vascular disease. ? Compromise your defense (immune) system or white blood count.  Any surgeries you have had.  Whether you are pregnant or may be pregnant. What are the risks? Generally, this is a safe procedure. However, problems may occur, including:  Infection.  Bleeding.  Damage to healthy tissue in and around your wound.  Soreness or pain.  Failure of the wound to heal.  Scarring.  What happens before the procedure? You may be given antibiotic medicine to help prevent infection. What happens during the procedure?  Your health care provider may apply a numbing medicine (topical anesthetic) to the wound.  Your health care provider will irrigate your wound with a germ-free (sterile), salt-water (saline) solution. This removes debris, bacteria, and dead tissue.  Depending on what type of mechanical wound debridement you are having, your health care provider may do one of the following: ? Put a dressing on your wound. You may have dry gauze pad placed into the wound. Your health care provider will remove the gauze after the wound is dry. Any dead tissue and debris that has dried into the gauze will be  lifted out of the wound (wet-to-dry debridement). ? Use a type of pad (monofilament fiber debridement pad). This pad has a fluffy surface on one side that picks up dead tissue and debris from your wound. Your health care provider wets the pad and wipes it over your wound for several minutes. ? Irrigate your wound with a pressurized stream of solution such as saline or water.  Once your health care provider is finished, he or she may apply a light dressing to your wound. The procedure may vary among health care providers and hospitals. What happens after the procedure?  You may receive medicine for pain.  You will continue to receive antibiotic medicine if it was started before your procedure. This information is not intended to replace advice given to you by your health care provider. Make sure you discuss any questions you have with your health care provider. Document Released: 08/26/2015 Document Revised: 05/12/2016 Document Reviewed: 04/15/2015 Elsevier Interactive Patient Education  [employer].

## 2017-06-10 NOTE — Progress Notes (Signed)
Patient ID: Colton Stanley, male   DOB: 04/15/1966, 51 y.o.   MRN: [id number] 5 Days Post-Op   Subjective: No major complaints. Mild incisional pain that continues to improve. Tolerating full liquids without nausea, fills up quickly. Colostomy functioning.  Objective: Vital signs in last 24 hours: Temp:  [98.3 F (36.8 C)-99.3 F (37.4 C)] 98.3 F (36.8 C) (06/23 0520) Pulse Rate:  [61-76] 61 (06/23 0520) Resp:  [16] 16 (06/23 0520) BP: (108-115)/(57-76) 108/69 (06/23 0520) SpO2:  [88 %-97 %] 97 % (06/23 0520) Last BM Date: 06/08/17  Intake/Output from previous day: 06/22 0701 - 06/23 0700 In: 3942.5 [P.O.:1200; I.V.:2342.5; IV Piggyback:400] Out: 4250 [Urine:3650; Stool:600] Intake/Output this shift: No intake/output data recorded.  General appearance: alert, cooperative and no distress GI: Nondistended. Soft and nontender. Stool in ostomy bag. Incision/Wound: Dressed, clean and dry  Lab Results:  No results for input(s): WBC, HGB, HCT, PLT in the last 72 hours. BMET No results for input(s): NA, K, CL, CO2, GLUCOSE, BUN, CREATININE, CALCIUM in the last 72 hours.   Studies/Results: No results found.  Anti-infectives: Anti-infectives    Start     Dose/Rate Route Frequency Ordered Stop   06/07/17 1400  Ampicillin-Sulbactam (UNASYN) 3 g in sodium chloride 0.9 % 100 mL IVPB     3 g 200 mL/hr over 30 Minutes Intravenous Every 6 hours 06/07/17 1345     06/05/17 2200  piperacillin-tazobactam (ZOSYN) IVPB 3.375 g  Status:  Discontinued     3.375 g 12.5 mL/hr over 240 Minutes Intravenous Every 8 hours 06/05/17 1608 06/07/17 1345   06/05/17 1345  piperacillin-tazobactam (ZOSYN) IVPB 3.375 g     3.375 g 100 mL/hr over 30 Minutes Intravenous  Once 06/05/17 1335 06/05/17 1553      Assessment/Plan: Perforated sigmoid diverticulitis with pelvic abscess and small bowel obstruction s/p Procedure(s): Hartmann colectomy with lysis of adhesions Progressing well. Advance to  regular diet. Ambulation encouraged. Possible discharge tomorrow. Continue IV antibiotics.    LOS: 5 days    Yaslyn Cumby T 06/10/2017

## 2017-06-10 NOTE — Progress Notes (Addendum)
Patient refused dressing change insisted to have it done in the morning. We will continue to monitor

## 2017-06-11 MED ORDER — AMOXICILLIN-POT CLAVULANATE 875-125 MG PO TABS: 1.0000 | ORAL_TABLET | Freq: Two times a day (BID) | ORAL | 0 refills | Status: AC

## 2017-06-11 MED ORDER — HYDROCODONE-ACETAMINOPHEN 5-325 MG PO TABS: 1.0000 | ORAL_TABLET | ORAL | 0 refills | Status: DC | PRN

## 2017-06-11 NOTE — Discharge Summary (Signed)
  Patient ID: Colton Stanley [id number] 51 y.o. 1966/12/03  06/05/2017  Discharge date and time: 06/11/2017   Admitting Physician: Glenna FellowsHOXWORTH,Daine Croker T  Discharge Physician: Glenna FellowsHOXWORTH,Ulla Mckiernan T  Admission Diagnoses: Small bowel obstruction with pelvic abscess  Discharge Diagnoses: Same secondary to perforated sigmoid diverticulitis  Operations: Procedure(s): Sigmoid colectomy with colostomy with drainage of pelvic abscess and lysis of adhesions  Admission Condition: poor  Discharged Condition: good  Indication for Admission: Patient is a generally healthy 51 year old male who presents with proximally one week of abdominal distention low and mid abdominal pain and nausea and vomiting. CT scan revealed high-grade small bowel obstruction with a fluid and gas collection in the pelvis consistent with abscess and appeared to be the point of obstruction. Source of the abscess was not clear. With these findings admission and laparotomy were recommended.  Hospital Course: The patient received preoperative broad-spectrum IV antibiotics and was taken to the operating room for exploration. He had a pelvic abscess, contained, secondary to perforated sigmoid diverticulitis and a resultant high-grade small bowel obstruction. He underwent sigmoid colectomy with end colostomy and lysis of adhesions. His postoperative course was unremarkable. NG was removed after 2 days when he began to have bowel function. Midline wound was left open and treated with dressing changes. He was seen by the wound ostomy nurse and was comfortable caring for his colostomy. I was able to be steadily advanced. White count returned to normal. He was continued on IV Zosyn. On the day of discharge she is tolerating a regular diet. Abdomen is soft and nontender. Ostomy is functioning. Last white blood count was normal and he has no fever.   Disposition: Home  Patient Instructions:  Allergies as of 06/11/2017   No Known Allergies      Medication List    STOP taking these medications   loperamide 2 MG capsule Commonly known as:  IMODIUM     TAKE these medications   amoxicillin-clavulanate 875-125 MG tablet Commonly known as:  AUGMENTIN Take 1 tablet by mouth 2 (two) times daily.   dicyclomine 20 MG tablet Commonly known as:  BENTYL Take 1 tablet (20 mg total) by mouth 2 (two) times daily.   HYDROcodone-acetaminophen 5-325 MG tablet Commonly known as:  NORCO/VICODIN Take 1-2 tablets by mouth every 4 (four) hours as needed for moderate pain.   ondansetron 4 MG disintegrating tablet Commonly known as:  ZOFRAN ODT Take 1 tablet (4 mg total) by mouth every 8 (eight) hours as needed for nausea or vomiting.       Activity: no heavy lifting for 5 weeks Diet: regular diet Wound Care: Daily moist saline gauze packing  Follow-up:  With Dr. Johna SheriffHoxworth in 3 weeks.  Signed: Mariella SaaBenjamin T Ryott Rafferty MD, FACS  06/11/2017, 8:24 AM

## 2017-06-11 NOTE — Progress Notes (Signed)
Nurse reviewed discharge instructions with pt.  Pt verbalized understanding of discharge instructions, follow up appointment and new medications.  Prescription given to pt prior to discharge. 

## 2017-06-11 NOTE — Progress Notes (Signed)
Patient ID: Colton CiproMalcolm Stanley, male   DOB: Jun 01, 1966, 51 y.o.   MRN: [id number] 6 Days Post-Op   Subjective: Continues to feel better. Tolerating regular diet without difficulty and colostomy functioning. Ambulatory. Feels ready to go home.  Objective: Vital signs in last 24 hours: Temp:  [98.6 F (37 C)-99.3 F (37.4 C)] 98.6 F (37 C) (06/24 0535) Pulse Rate:  [69-77] 69 (06/24 0535) Resp:  [16-18] 18 (06/24 0535) BP: (103-119)/(57-69) 103/57 (06/24 0535) SpO2:  [91 %-93 %] 93 % (06/24 0535) Last BM Date: 06/08/17  Intake/Output from previous day: 06/23 0701 - 06/24 0700 In: 2740 [P.O.:940; I.V.:1500; IV Piggyback:300] Out: 3945 [Urine:3820; Stool:125] Intake/Output this shift: Total I/O In: -  Out: 150 [Stool:150]  General appearance: alert, cooperative and no distress GI: Nondistended. Soft and nontender. Incision/Wound: Packed with saline gauze, no erythema or unusual drainage  Lab Results:  No results for input(s): WBC, HGB, HCT, PLT in the last 72 hours. BMET No results for input(s): NA, K, CL, CO2, GLUCOSE, BUN, CREATININE, CALCIUM in the last 72 hours.   Studies/Results: No results found.  Anti-infectives: Anti-infectives    Start     Dose/Rate Route Frequency Ordered Stop   06/07/17 1400  Ampicillin-Sulbactam (UNASYN) 3 g in sodium chloride 0.9 % 100 mL IVPB     3 g 200 mL/hr over 30 Minutes Intravenous Every 6 hours 06/07/17 1345     06/05/17 2200  piperacillin-tazobactam (ZOSYN) IVPB 3.375 g  Status:  Discontinued     3.375 g 12.5 mL/hr over 240 Minutes Intravenous Every 8 hours 06/05/17 1608 06/07/17 1345   06/05/17 1345  piperacillin-tazobactam (ZOSYN) IVPB 3.375 g     3.375 g 100 mL/hr over 30 Minutes Intravenous  Once 06/05/17 1335 06/05/17 1553      Assessment/Plan: Status post Hartmann colectomy for perforated diverticulitis with pelvic abscess and small bowel obstruction. Doing very well and ready for discharge. Follow-up with 1 week of oral  antibiotics at home.    LOS: 6 days    Colton Stanley T 06/11/2017

## 2017-06-11 NOTE — Care Management Note (Signed)
Case Management Note  Patient Details  Name: Colton Stanley MRN: [id number] Date of Birth: 1966-08-01  Subjective/Objective:     Small bowel obstruction, sigmoid colectomy with colostomy with drainage of pelvic abscess and lysis of adhesions               Action/Plan: Discharge Planning: Contacted [facility] to update on colostomy and wound care post dc. Notified MD and orders clarified.    Expected Discharge Date:  06/11/17               Expected Discharge Plan:  Home w Home Health Services  In-House Referral:  NA  Discharge planning Services  CM Consult  Post Acute Care Choice:  NA Choice offered to:  Patient  DME Arranged:  N/A DME Agency:  NA  HH Arranged:  RN, Disease Management HH Agency:  [facility]  Status of Service:  Completed, signed off  If discussed at Long Length of Stay Meetings, dates discussed:    Additional Comments:  [name], [name], RN 06/11/2017, 10:51 AM

## 2017-06-14 NOTE — Progress Notes (Signed)
[facility] concerning follow with pt at home. Pt called WOC RN related to no one coming out to the home.

## 2017-09-01 ENCOUNTER — Ambulatory Visit: Payer: Self-pay | Admitting: General Surgery

## 2017-09-04 ENCOUNTER — Other Ambulatory Visit: Payer: Self-pay | Admitting: General Surgery

## 2017-09-04 DIAGNOSIS — Z933 Colostomy status: Secondary | ICD-10-CM

## 2017-09-11 ENCOUNTER — Ambulatory Visit
Admission: RE | Admit: 2017-09-11 | Discharge: 2017-09-11 | Disposition: A | Discharge status: Home / Self Care | Payer: BLUE CROSS/BLUE SHIELD | Source: Ambulatory Visit | Attending: General Surgery | Admitting: General Surgery

## 2017-09-11 DIAGNOSIS — Z933 Colostomy status: Secondary | ICD-10-CM

## 2017-10-18 NOTE — Patient Instructions (Signed)
Jodell CiproMalcolm Lafever  10/18/2017   Your procedure is scheduled on: 10/25/17  [facility] [place]vators to 3rd floor to  Short Stay Center at     0630 AM.    Call this number if you have problems the morning of surgery [phone]    Remember: ONLY 1 PERSON MAY GO WITH YOU TO SHORT STAY TO GET  READY MORNING OF YOUR SURGERY.  Do not eat food or drink liquids :After Midnight.    FOLLOW BOWEL PREP PER MD   Take these medicines the morning of surgery with A SIP OF WATER: none                                You may not have any metal on your body including hair pins and              piercings  Do not wear jewelry,lotions, powders or perfumes, deodorant                      Men may shave face and neck.   Do not bring valuables to the hospital. [facility] IS NOT             RESPONSIBLE   FOR VALUABLES.  Contacts, dentures or bridgework may not be worn into surgery.  Leave suitcase in the car. After surgery it may be brought to your room.                 Please read over the following fact sheets you were given: _____________________________________________________________________          [facility] - Pre[facility] Before surgery, you can play an important role.  Because skin is not sterile, your skin needs to be as free of germs as possible.  You can reduce the number of germs on your skin by washing with CHG (chlorahexidine gluconate) soap before surgery.  CHG is an antiseptic cleaner which kills germs and bonds with the skin to continue killing germs even after washing. Please DO NOT use if you have an allergy to CHG or antibacterial soaps.  If your skin becomes reddened/irritated stop using the CHG and inform your nurse when you arrive at Short Stay. Do not shave (including legs and underarms) for at least 48 hours prior to the first CHG shower.  You may shave your face/neck. Please follow these instructions carefully:  1.   Shower with CHG Soap the night before surgery and the  morning of Surgery.  2.  If you choose to wash your hair, wash your hair first as usual with your  normal  shampoo.  3.  After you shampoo, rinse your hair and body thoroughly to remove the  shampoo.                           4.  Use CHG as you would any other liquid soap.  You can apply chg directly  to the skin and wash                       Gently with a scrungie or clean washcloth.  5.  Apply the CHG Soap to your body ONLY FROM THE NECK DOWN.   Do not use  on face/ open                           Wound or open sores. Avoid contact with eyes, ears mouth and genitals (private parts).                       Wash face,  Genitals (private parts) with your normal soap.             6.  Wash thoroughly, paying special attention to the area where your surgery  will be performed.  7.  Thoroughly rinse your body with warm water from the neck down.  8.  DO NOT shower/wash with your normal soap after using and rinsing off  the CHG Soap.                9.  Pat yourself dry with a clean towel.            10.  Wear clean pajamas.            11.  Place clean sheets on your bed the night of your first shower and do not  sleep with pets. Day of Surgery : Do not apply any lotions/deodorants the morning of surgery.  Please wear clean clothes to the hospital/surgery center.  FAILURE TO FOLLOW THESE INSTRUCTIONS MAY RESULT IN THE CANCELLATION OF YOUR SURGERY PATIENT SIGNATURE_________________________________  NURSE SIGNATURE__________________________________  ________________________________________________________________________    CLEAR LIQUID DIET  THE DAY OF YOUR BOWEL PREP   Foods Allowed                                                                     Foods Excluded  Coffee and tea, regular and decaf                             liquids that you cannot  Plain Jell-O in any flavor                                             see through such  as: Fruit ices (not with fruit pulp)                                     milk, soups, orange juice  Iced Popsicles                                    All solid food Carbonated beverages, regular and diet                                    Cranberry, grape and apple juices Sports drinks like Gatorade Lightly seasoned clear broth or consume(fat free) Sugar, honey syrup  Sample Menu Breakfast  Lunch                                     Supper Cranberry juice                    Beef broth                            Chicken broth Jell-O                                     Grape juice                           Apple juice Coffee or tea                        Jell-O                                      Popsicle                                                Coffee or tea                        Coffee or tea  _____________________________________________________________________   WHAT IS A BLOOD TRANSFUSION? Blood Transfusion Information  A transfusion is the replacement of blood or some of its parts. Blood is made up of multiple cells which provide different functions.  Red blood cells carry oxygen and are used for blood loss replacement.  White blood cells fight against infection.  Platelets control bleeding.  Plasma helps clot blood.  Other blood products are available for specialized needs, such as hemophilia or other clotting disorders. BEFORE THE TRANSFUSION  Who gives blood for transfusions?   Healthy volunteers who are fully evaluated to make sure their blood is safe. This is blood bank blood. Transfusion therapy is the safest it has ever been in the practice of medicine. Before blood is taken from a donor, a complete history is taken to make sure that person has no history of diseases nor engages in risky social behavior (examples are intravenous drug use or sexual activity with multiple partners). The donor's travel history is screened to minimize  risk of transmitting infections, such as malaria. The donated blood is tested for signs of infectious diseases, such as HIV and hepatitis. The blood is then tested to be sure it is compatible with you in order to minimize the chance of a transfusion reaction. If you or a relative donates blood, this is often done in anticipation of surgery and is not appropriate for emergency situations. It takes many days to process the donated blood. RISKS AND COMPLICATIONS Although transfusion therapy is very safe and saves many lives, the main dangers of transfusion include:   Getting an infectious disease.  Developing a transfusion reaction. This is an allergic reaction to something in the blood you were given. Every precaution is taken to prevent this. The decision to have  a blood transfusion has been considered carefully by your caregiver before blood is given. Blood is not given unless the benefits outweigh the risks. AFTER THE TRANSFUSION  Right after receiving a blood transfusion, you will usually feel much better and more energetic. This is especially true if your red blood cells have gotten low (anemic). The transfusion raises the level of the red blood cells which carry oxygen, and this usually causes an energy increase.  The nurse administering the transfusion will monitor you carefully for complications. HOME CARE INSTRUCTIONS  No special instructions are needed after a transfusion. You may find your energy is better. Speak with your caregiver about any limitations on activity for underlying diseases you may have. SEEK MEDICAL CARE IF:   Your condition is not improving after your transfusion.  You develop redness or irritation at the intravenous (IV) site. SEEK IMMEDIATE MEDICAL CARE IF:  Any of the following symptoms occur over the next 12 hours:  Shaking chills.  You have a temperature by mouth above 102 F (38.9 C), not controlled by medicine.  Chest, back, or muscle pain.  People  around you feel you are not acting correctly or are confused.  Shortness of breath or difficulty breathing.  Dizziness and fainting.  You get a rash or develop hives.  You have a decrease in urine output.  Your urine turns a dark color or changes to pink, red, or brown. Any of the following symptoms occur over the next 10 days:  You have a temperature by mouth above 102 F (38.9 C), not controlled by medicine.  Shortness of breath.  Weakness after normal activity.  The white part of the eye turns yellow (jaundice).  You have a decrease in the amount of urine or are urinating less often.  Your urine turns a dark color or changes to pink, red, or brown. Document Released: 12/02/2000 Document Revised: 02/27/2012 Document Reviewed: 07/21/2008 ExitCare Patient Information 2014 ExitCare, [place].  _______________________________________________________________________ COLON BOWEL PREP Please follow the instructions carefully. It is important to clean out your bowels & take the prescribed antibiotic pills to lower your chances of a wound infection or abscess.   FIVE DAYS PRIOR TO YOUR SURGERY Stop eating any nuts, popcorn, or fruit with seeds. Stop all fiber supplements such as Metamucil, Citrucel, etc.   Hold taking any blood thinning anticoagulation medication (ex: aspirin, warfarin/Coumadin, Plavix, Xarelto, Eliquis, Pradaxa, etc) as recommended by your medical/cardiology doctor  Obtain what you need at a pharmacy of your choice: -Filled out prescriptions for your oral antibiotics (Neomycin & Metronidazole)  -A bottle of MiraLax / Glycolax (288g) - no prescription required  -A large bottle of Gatorade / Powerade (64oz)  -Dulcolax tablets (4 tabs) - no prescription required   DAY PRIOR TO SURGERY   7:00am Swallow 4 Dulcolax tablets with some water Drink plenty of clear liquids all day to avoid getting dehydrated (Water, juice, soda, coffee, tea, bouillon, jello,  etc.)  10:00am Mix the bottle of MiraLax with the 64-oz bottle of Gatorade.  Drink the Gatorade mixture gradually over the next few hours (8oz glass every 15-30 minutes) until gone. You should finish by 2pm.  2:00pm Take 2 Neomycin 500mg  tablets & 2 Metronidazole 500mg  tablets  3:00pm Take 2 Neomycin 500mg  tablets & 2 Metronidazole 500mg  tablets  Drink plenty of clear liquids all evening to avoid getting dehydrated  10:00pm Take 2 Neomycin 500mg  tablets & 2 Metronidazole 500mg  tablets  Do not eat or drink anything after bedtime (midnight) the night  before your surgery.   MORNING OF SURGERY Remember to not to drink or eat anything that morning  Hold or take medications as recommended by the hospital staff at your Preoperative visit  If you have questions or concerns, please call [facility] [phone] during business hours to speak to the clinical staff for advice.

## 2017-10-20 ENCOUNTER — Encounter (HOSPITAL_COMMUNITY)
Admission: RE | Admit: 2017-10-20 | Discharge: 2017-10-20 | Disposition: A | Discharge status: Home / Self Care | Payer: BLUE CROSS/BLUE SHIELD | Source: Ambulatory Visit | Attending: General Surgery | Admitting: General Surgery

## 2017-10-20 ENCOUNTER — Encounter (HOSPITAL_COMMUNITY): Payer: Self-pay

## 2017-10-20 DIAGNOSIS — Z01812 Encounter for preprocedural laboratory examination: Secondary | ICD-10-CM | POA: Insufficient documentation

## 2017-10-20 HISTORY — DX: Diverticulitis of intestine, part unspecified, without perforation or abscess without bleeding: K57.92

## 2017-10-20 LAB — HEMOGLOBIN A1C
HEMOGLOBIN A1C: 5.6 % (ref 4.8–5.6)
MEAN PLASMA GLUCOSE: 114.02 mg/dL

## 2017-10-20 LAB — CBC
HEMATOCRIT: 46.1 % (ref 39.0–52.0)
Hemoglobin: 15.9 g/dL (ref 13.0–17.0)
MCH: 31.1 pg (ref 26.0–34.0)
MCHC: 34.5 g/dL (ref 30.0–36.0)
MCV: 90 fL (ref 78.0–100.0)
PLATELETS: 303 10*3/uL (ref 150–400)
RBC: 5.12 MIL/uL (ref 4.22–5.81)
RDW: 12.4 % (ref 11.5–15.5)
WBC: 8.2 10*3/uL (ref 4.0–10.5)

## 2017-10-20 LAB — ABO/RH: ABO/RH(D): O NEG

## 2017-10-24 NOTE — Anesthesia Preprocedure Evaluation (Addendum)
Anesthesia Evaluation  Patient identified by MRN, date of birth, ID band Patient awake    Reviewed: Allergy & Precautions, NPO status , Patient's Chart, lab work & pertinent test results  Airway Mallampati: II  TM Distance: >3 FB Neck ROM: Full    Dental no notable dental hx. (+) Dental Advisory Given   Pulmonary former smoker,    breath sounds clear to auscultation       Cardiovascular negative cardio ROS   Rhythm:Regular Rate:Normal     Neuro/Psych negative neurological ROS  negative psych ROS   GI/Hepatic Neg liver ROS, S/p colon resection and ostomy creation   Endo/Other  negative endocrine ROS  Renal/GU negative Renal ROS  negative genitourinary   Musculoskeletal negative musculoskeletal ROS (+)   Abdominal   Peds negative pediatric ROS (+)  Hematology negative hematology ROS (+)   Anesthesia Other Findings   Reproductive/Obstetrics negative OB ROS                            Anesthesia Physical  Anesthesia Plan  ASA: II  Anesthesia Plan: General   Post-op Pain Management:    Induction: Intravenous  PONV Risk Score and Plan: 3 and Ondansetron, Dexamethasone, Propofol, Midazolam and Treatment may vary due to age or medical condition  Airway Management Planned: Oral ETT  Additional Equipment: None  Intra-op Plan:   Post-operative Plan: Extubation in OR  Informed Consent: I have reviewed the patients History and Physical, chart, labs and discussed the procedure including the risks, benefits and alternatives for the proposed anesthesia with the patient or authorized representative who has indicated his/her understanding and acceptance.   Dental advisory given  Plan Discussed with: CRNA  Anesthesia Plan Comments: (ERAS Pathway)       Anesthesia Quick Evaluation

## 2017-10-25 ENCOUNTER — Encounter (HOSPITAL_COMMUNITY): Payer: Self-pay | Admitting: Emergency Medicine

## 2017-10-25 ENCOUNTER — Other Ambulatory Visit: Payer: Self-pay

## 2017-10-25 ENCOUNTER — Inpatient Hospital Stay (HOSPITAL_COMMUNITY): Payer: BLUE CROSS/BLUE SHIELD | Admitting: Anesthesiology

## 2017-10-25 ENCOUNTER — Inpatient Hospital Stay (HOSPITAL_COMMUNITY)
Admission: RE | Admit: 2017-10-25 | Discharge: 2017-10-27 | DRG: 346 | Disposition: A | Discharge status: Home / Self Care | Payer: BLUE CROSS/BLUE SHIELD | Source: Ambulatory Visit | Attending: General Surgery | Admitting: General Surgery

## 2017-10-25 ENCOUNTER — Encounter (HOSPITAL_COMMUNITY)
Admission: RE | Disposition: A | Discharge status: Home / Self Care | Payer: Self-pay | Source: Ambulatory Visit | Attending: General Surgery

## 2017-10-25 DIAGNOSIS — Z9889 Other specified postprocedural states: Secondary | ICD-10-CM

## 2017-10-25 DIAGNOSIS — Z87891 Personal history of nicotine dependence: Secondary | ICD-10-CM | POA: Diagnosis not present

## 2017-10-25 DIAGNOSIS — Z79899 Other long term (current) drug therapy: Secondary | ICD-10-CM

## 2017-10-25 DIAGNOSIS — Z433 Encounter for attention to colostomy: Secondary | ICD-10-CM | POA: Diagnosis present

## 2017-10-25 HISTORY — PX: COLOSTOMY TAKEDOWN: SHX5783

## 2017-10-25 LAB — TYPE AND SCREEN
ABO/RH(D): O NEG
ANTIBODY SCREEN: NEGATIVE

## 2017-10-25 SURGERY — CLOSURE, COLOSTOMY
Anesthesia: General

## 2017-10-25 MED ORDER — ONDANSETRON HCL 4 MG/2ML IJ SOLN: 4.0000 mg | Freq: Four times a day (QID) | INTRAMUSCULAR | Status: DC | PRN

## 2017-10-25 MED ORDER — SUGAMMADEX SODIUM 200 MG/2ML IV SOLN
INTRAVENOUS | Status: DC | PRN
  Administered 2017-10-25: 150 mg via INTRAVENOUS

## 2017-10-25 MED ORDER — HYDROMORPHONE HCL 1 MG/ML IJ SOLN
0.2500 mg | INTRAMUSCULAR | Status: DC | PRN
  Administered 2017-10-25 (×2): 0.5 mg via INTRAVENOUS

## 2017-10-25 MED ORDER — CEFOTETAN DISODIUM-DEXTROSE 2-2.08 GM-%(50ML) IV SOLR
2.0000 g | INTRAVENOUS | Status: AC
  Administered 2017-10-25: 2 g via INTRAVENOUS

## 2017-10-25 MED ORDER — DEXAMETHASONE SODIUM PHOSPHATE 10 MG/ML IJ SOLN
INTRAMUSCULAR | Status: AC
  Filled 2017-10-25: qty 1

## 2017-10-25 MED ORDER — CHLORHEXIDINE GLUCONATE CLOTH 2 % EX PADS: 6.0000 | MEDICATED_PAD | Freq: Once | CUTANEOUS | Status: DC

## 2017-10-25 MED ORDER — PROMETHAZINE HCL 25 MG/ML IJ SOLN: 6.2500 mg | INTRAMUSCULAR | Status: DC | PRN

## 2017-10-25 MED ORDER — LIDOCAINE 2% (20 MG/ML) 5 ML SYRINGE
INTRAMUSCULAR | Status: DC | PRN
  Administered 2017-10-25: 1.5 mg/kg/h via INTRAVENOUS

## 2017-10-25 MED ORDER — MIDAZOLAM HCL 5 MG/5ML IJ SOLN
INTRAMUSCULAR | Status: DC | PRN
  Administered 2017-10-25: 2 mg via INTRAVENOUS

## 2017-10-25 MED ORDER — ACETAMINOPHEN 500 MG PO TABS
1000.0000 mg | ORAL_TABLET | ORAL | Status: AC
  Administered 2017-10-25: 1000 mg via ORAL
  Filled 2017-10-25: qty 2

## 2017-10-25 MED ORDER — ENOXAPARIN SODIUM 40 MG/0.4ML ~~LOC~~ SOLN
40.0000 mg | SUBCUTANEOUS | Status: DC
  Administered 2017-10-26 – 2017-10-27 (×2): 40 mg via SUBCUTANEOUS
  Filled 2017-10-25: qty 0.4

## 2017-10-25 MED ORDER — FENTANYL CITRATE (PF) 100 MCG/2ML IJ SOLN
INTRAMUSCULAR | Status: AC
  Filled 2017-10-25: qty 2

## 2017-10-25 MED ORDER — DEXAMETHASONE SODIUM PHOSPHATE 10 MG/ML IJ SOLN
INTRAMUSCULAR | Status: DC | PRN
  Administered 2017-10-25: 10 mg via INTRAVENOUS

## 2017-10-25 MED ORDER — PROPOFOL 10 MG/ML IV BOLUS
INTRAVENOUS | Status: DC | PRN
  Administered 2017-10-25: 120 mg via INTRAVENOUS

## 2017-10-25 MED ORDER — LIDOCAINE HCL 2 % IJ SOLN
INTRAMUSCULAR | Status: AC
  Filled 2017-10-25: qty 20

## 2017-10-25 MED ORDER — MORPHINE SULFATE (PF) 2 MG/ML IV SOLN
2.0000 mg | INTRAVENOUS | Status: DC | PRN
  Administered 2017-10-25 – 2017-10-26 (×6): 2 mg via INTRAVENOUS
  Filled 2017-10-25 (×6): qty 1

## 2017-10-25 MED ORDER — LIDOCAINE 2% (20 MG/ML) 5 ML SYRINGE
INTRAMUSCULAR | Status: DC | PRN
  Administered 2017-10-25: 100 mg via INTRAVENOUS

## 2017-10-25 MED ORDER — CELECOXIB 200 MG PO CAPS
200.0000 mg | ORAL_CAPSULE | Freq: Two times a day (BID) | ORAL | Status: DC
  Administered 2017-10-25 – 2017-10-27 (×4): 200 mg via ORAL
  Filled 2017-10-25 (×4): qty 1

## 2017-10-25 MED ORDER — MIDAZOLAM HCL 2 MG/2ML IJ SOLN
INTRAMUSCULAR | Status: AC
  Filled 2017-10-25: qty 2

## 2017-10-25 MED ORDER — CELECOXIB 200 MG PO CAPS
200.0000 mg | ORAL_CAPSULE | ORAL | Status: AC
  Administered 2017-10-25: 200 mg via ORAL
  Filled 2017-10-25: qty 1

## 2017-10-25 MED ORDER — ALVIMOPAN 12 MG PO CAPS
12.0000 mg | ORAL_CAPSULE | Freq: Two times a day (BID) | ORAL | Status: DC
  Administered 2017-10-26 (×2): 12 mg via ORAL
  Filled 2017-10-25 (×2): qty 1

## 2017-10-25 MED ORDER — KCL-LACTATED RINGERS-D5W 20 MEQ/L IV SOLN
INTRAVENOUS | Status: DC
  Administered 2017-10-25: 17:00:00 via INTRAVENOUS
  Administered 2017-10-26: 100 mL/h via INTRAVENOUS
  Administered 2017-10-26 – 2017-10-27 (×2): via INTRAVENOUS
  Filled 2017-10-25 (×5): qty 1000

## 2017-10-25 MED ORDER — ALVIMOPAN 12 MG PO CAPS
12.0000 mg | ORAL_CAPSULE | ORAL | Status: AC
  Administered 2017-10-25: 12 mg via ORAL
  Filled 2017-10-25: qty 1

## 2017-10-25 MED ORDER — NEOMYCIN SULFATE 500 MG PO TABS: 1000.0000 mg | ORAL_TABLET | ORAL | Status: DC

## 2017-10-25 MED ORDER — ALUM & MAG HYDROXIDE-SIMETH 200-200-20 MG/5ML PO SUSP: 30.0000 mL | Freq: Four times a day (QID) | ORAL | Status: DC | PRN

## 2017-10-25 MED ORDER — CEFOTETAN DISODIUM-DEXTROSE 2-2.08 GM-%(50ML) IV SOLR
INTRAVENOUS | Status: AC
  Filled 2017-10-25: qty 50

## 2017-10-25 MED ORDER — SUGAMMADEX SODIUM 200 MG/2ML IV SOLN
INTRAVENOUS | Status: AC
  Filled 2017-10-25: qty 2

## 2017-10-25 MED ORDER — PROPOFOL 10 MG/ML IV BOLUS
INTRAVENOUS | Status: AC
  Filled 2017-10-25: qty 20

## 2017-10-25 MED ORDER — ONDANSETRON HCL 4 MG PO TABS: 4.0000 mg | ORAL_TABLET | Freq: Four times a day (QID) | ORAL | Status: DC | PRN

## 2017-10-25 MED ORDER — GABAPENTIN 300 MG PO CAPS
300.0000 mg | ORAL_CAPSULE | ORAL | Status: AC
  Administered 2017-10-25: 300 mg via ORAL
  Filled 2017-10-25: qty 1

## 2017-10-25 MED ORDER — GABAPENTIN 300 MG PO CAPS
300.0000 mg | ORAL_CAPSULE | Freq: Two times a day (BID) | ORAL | Status: DC
  Administered 2017-10-25 – 2017-10-27 (×4): 300 mg via ORAL
  Filled 2017-10-25 (×4): qty 1

## 2017-10-25 MED ORDER — POLYETHYLENE GLYCOL 3350 17 GM/SCOOP PO POWD: 1.0000 | Freq: Once | ORAL | Status: DC

## 2017-10-25 MED ORDER — METRONIDAZOLE 500 MG PO TABS: 1000.0000 mg | ORAL_TABLET | ORAL | Status: DC

## 2017-10-25 MED ORDER — OXYCODONE-ACETAMINOPHEN 5-325 MG PO TABS
1.0000 | ORAL_TABLET | ORAL | Status: DC | PRN
  Administered 2017-10-25 – 2017-10-27 (×7): 2 via ORAL
  Administered 2017-10-27: 1 via ORAL
  Administered 2017-10-27: 2 via ORAL
  Filled 2017-10-25 (×9): qty 2

## 2017-10-25 MED ORDER — ROCURONIUM BROMIDE 10 MG/ML (PF) SYRINGE
PREFILLED_SYRINGE | INTRAVENOUS | Status: DC | PRN
  Administered 2017-10-25: 20 mg via INTRAVENOUS
  Administered 2017-10-25: 10 mg via INTRAVENOUS
  Administered 2017-10-25: 40 mg via INTRAVENOUS

## 2017-10-25 MED ORDER — SODIUM CHLORIDE 0.9 % IJ SOLN
INTRAMUSCULAR | Status: AC
  Filled 2017-10-25: qty 50

## 2017-10-25 MED ORDER — FENTANYL CITRATE (PF) 100 MCG/2ML IJ SOLN
INTRAMUSCULAR | Status: DC | PRN
  Administered 2017-10-25 (×4): 25 ug via INTRAVENOUS

## 2017-10-25 MED ORDER — ONDANSETRON HCL 4 MG/2ML IJ SOLN
INTRAMUSCULAR | Status: AC
  Filled 2017-10-25: qty 2

## 2017-10-25 MED ORDER — HYDROMORPHONE HCL 1 MG/ML IJ SOLN
INTRAMUSCULAR | Status: AC
  Filled 2017-10-25: qty 1

## 2017-10-25 MED ORDER — FENTANYL CITRATE (PF) 100 MCG/2ML IJ SOLN
25.0000 ug | INTRAMUSCULAR | Status: DC | PRN
  Administered 2017-10-25 (×3): 50 ug via INTRAVENOUS

## 2017-10-25 MED ORDER — ONDANSETRON HCL 4 MG/2ML IJ SOLN
INTRAMUSCULAR | Status: DC | PRN
  Administered 2017-10-25: 4 mg via INTRAVENOUS

## 2017-10-25 MED ORDER — KETAMINE HCL 10 MG/ML IJ SOLN
INTRAMUSCULAR | Status: DC | PRN
  Administered 2017-10-25: 20 mg via INTRAVENOUS
  Administered 2017-10-25: 30 mg via INTRAVENOUS

## 2017-10-25 MED ORDER — LACTATED RINGERS IV SOLN
INTRAVENOUS | Status: DC | PRN
  Administered 2017-10-25 (×3): via INTRAVENOUS

## 2017-10-25 MED ORDER — BUPIVACAINE LIPOSOME 1.3 % IJ SUSP
20.0000 mL | Freq: Once | INTRAMUSCULAR | Status: AC
  Administered 2017-10-25: 20 mL
  Filled 2017-10-25: qty 20

## 2017-10-25 MED ORDER — ALBUMIN HUMAN 5 % IV SOLN
INTRAVENOUS | Status: AC
  Filled 2017-10-25: qty 500

## 2017-10-25 MED ORDER — ROCURONIUM BROMIDE 50 MG/5ML IV SOSY
PREFILLED_SYRINGE | INTRAVENOUS | Status: AC
  Filled 2017-10-25: qty 5

## 2017-10-25 MED ORDER — KETAMINE HCL-SODIUM CHLORIDE 100-0.9 MG/10ML-% IV SOSY
PREFILLED_SYRINGE | INTRAVENOUS | Status: AC
  Filled 2017-10-25: qty 10

## 2017-10-25 MED ORDER — SODIUM CHLORIDE 0.9 % IJ SOLN
INTRAMUSCULAR | Status: DC | PRN
  Administered 2017-10-25: 20 mL

## 2017-10-25 MED ORDER — 0.9 % SODIUM CHLORIDE (POUR BTL) OPTIME
TOPICAL | Status: DC | PRN
  Administered 2017-10-25: 3000 mL

## 2017-10-25 SURGICAL SUPPLY — 36 items
BLADE EXTENDED COATED 6.5IN (ELECTRODE) ×3 IMPLANT
CELLS DAT CNTRL 66122 CELL SVR (MISCELLANEOUS) ×2 IMPLANT
COUNTER NEEDLE 20 DBL MAG RED (NEEDLE) ×3 IMPLANT
COVER MAYO STAND STRL (DRAPES) ×9 IMPLANT
DRAPE LAPAROSCOPIC ABDOMINAL (DRAPES) ×3 IMPLANT
DRAPE UTILITY XL STRL (DRAPES) ×3 IMPLANT
DRAPE WARM FLUID 44X44 (DRAPE) ×3 IMPLANT
DRSG OPSITE POSTOP 4X6 (GAUZE/BANDAGES/DRESSINGS) ×3 IMPLANT
DRSG OPSITE POSTOP 4X8 (GAUZE/BANDAGES/DRESSINGS) ×3 IMPLANT
ELECT PENCIL ROCKER SW 15FT (MISCELLANEOUS) ×6 IMPLANT
ELECT REM PT RETURN 15FT ADLT (MISCELLANEOUS) ×3 IMPLANT
GLOVE BIOGEL PI IND STRL 7.5 (GLOVE) ×2 IMPLANT
GLOVE BIOGEL PI INDICATOR 7.5 (GLOVE) ×4
GLOVE ECLIPSE 7.5 STRL STRAW (GLOVE) ×6 IMPLANT
GOWN STRL REUS W/TWL XL LVL3 (GOWN DISPOSABLE) ×18 IMPLANT
HANDLE SUCTION POOLE (INSTRUMENTS) ×1 IMPLANT
LEGGING LITHOTOMY PAIR STRL (DRAPES) ×3 IMPLANT
LUBRICANT JELLY K Y 4OZ (MISCELLANEOUS) ×3 IMPLANT
PACK COLON (CUSTOM PROCEDURE TRAY) ×3 IMPLANT
PAD POSITIONING PINK XL (MISCELLANEOUS) ×3 IMPLANT
RTRCTR WOUND ALEXIS 18CM MED (MISCELLANEOUS) ×6
SPONGE LAP 18X18 X RAY DECT (DISPOSABLE) ×6 IMPLANT
SUCTION POOLE HANDLE (INSTRUMENTS) ×3
SUT PDS AB 0 CT 36 (SUTURE) ×6 IMPLANT
SUT PDS AB 1 TP1 96 (SUTURE) ×6 IMPLANT
SUT SILK 2 0 (SUTURE) ×2
SUT SILK 2 0 SH CR/8 (SUTURE) ×9 IMPLANT
SUT SILK 2-0 18XBRD TIE 12 (SUTURE) ×1 IMPLANT
SUT SILK 3 0 (SUTURE) ×2
SUT SILK 3 0 SH CR/8 (SUTURE) ×3 IMPLANT
SUT SILK 3-0 18XBRD TIE 12 (SUTURE) ×1 IMPLANT
SUT VIC AB 3-0 SH 18 (SUTURE) ×3 IMPLANT
TOWEL OR 17X26 10 PK STRL BLUE (TOWEL DISPOSABLE) ×3 IMPLANT
TOWEL OR NON WOVEN STRL DISP B (DISPOSABLE) ×3 IMPLANT
TRAY FOLEY W/METER SILVER 16FR (SET/KITS/TRAYS/PACK) ×3 IMPLANT
YANKAUER SUCT BULB TIP 10FT TU (MISCELLANEOUS) ×3 IMPLANT

## 2017-10-25 NOTE — Op Note (Signed)
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  He has done well without complication.  Preop imaging showed the rectosigmoid patent to the mid sigmoid with just a couple of ticks near the end of the divided colon and normal proximal colon.  After preoperative discussion regarding the procedure and risks detailed elsewhere we have elected to proceed with open [facility]own of [facility].    Procedure Detail: Patient was brought to the operating room, placed in supine position on the operating table and general endotracheal anesthesia induced.  He had received preoperative IV antibiotics.  PAS were placed.  He was carefully positioned in semilithotomy in stirrups.  The colostomy was sutured closed with a pursestring.  The abdomen and perineum were widely sterilely prepped and draped.  Patient timeout was performed and correct procedure verified.  The previous low midline scar was excised back to normal skin taking a small chronic draining sinus tract likely stitch abscess from the upper part of the incision down to the fascia with no abnormality seen at that level.  The fascia and peritoneum were opened under direct vision.  There were minimal omental adhesions to the anterior abdominal wall that were taken down with cautery.  There were essentially no small bowel adhesions.  The rectosigmoid stump was identified in the pelvis.  The colostomy was freed up to the level of the abdominal wall.  The colostomy was then elliptically excised transversely and dissected away from subcutaneous tissue and fascia and reduced completely into the abdomen.  The colon was  mobilized dividing some lateral attachments with very good mobility down to the pubis.  Following this viscera packed in the upper abdomen and a wound protector placed.  The rectosigmoid stump was dissected from  Adhesions to the pelvic sidewall and mobilized with good length to the distal sigmoid.  There appeared to be a couple of ticks near the resection line but more distally soft normal bowel.  The 2 ends could be brought together easily under no tension.  Due to the very small caliber of the colon diffusely and the relatively long segment of rectosigmoid remaining I elected to do an open handsewn anastomosis.  The healthy portion of the distal sigmoid was identified and cleared of mesentery and pericolic fat.  2-0 silk stay sutures were placed and the distal colon divided at that point.  It was soft and healthy with good blood supply.  An area for division of the proximal colon about 5 cm proximal to the colostomy was chosen and cleaned of mesentery and pericolic fat and similarly divided.  Following that a single layer anastomosis was performed with inverting 2-0 silk sutures.  There was no tension with good blood supply.  The lumen appeared adequate.  Following completion of the anastomosis Dr. Doylene Canardonner with the rigid sigmoidoscope tensely distended the bowel with the colon clamped proximal to the anastomosis and there was good distention of the anastomosis appearing widely patent and no evidence of air leak under saline irrigation.  No bleeding or injury or other problems were seen.  At this point all the gloves gowns instruments were changed and the patient was redraped per colectomy protocol.  The abdomen was thoroughly irrigated.  No bleeding.  The incisions were  infiltrated with dilute Exparel.  The colostomy site fascia was closed with interrupted 0 Novafil.  The midline wound was closed with looped running #1 Novafil started either end and tied centrally.  Subcutaneous tissue was irrigated and skin  closed with staples.  Sponge needle and instrument counts were correct.    Findings: As above  Estimated Blood Loss:  less than 100 mL         Drains: None  Blood Given: none          Specimens: Colostomy and portion of sigmoid colon        Complications:  * No complications entered in OR log *         Disposition: PACU - hemodynamically stable.         Condition: stable

## 2017-10-25 NOTE — H&P (Signed)
Colton Stanley is an 51 y.o. male.   Chief Complaint: Colostomy  HPI: Patient is 5 months status post Hartmann colectomy for perforated diverticulitis.  He has done well postoperatively.  He has a preoperative barium enema showing normal appearance of the rectum and sigmoid colon to the mid sigmoid with just a few sigmoid diverticula and normal remaining colon per ostomy.  He is having no abdominal pain or fever.  We have previously discussed proceeding with takedown of his colostomy.  Following a mechanical and antibiotic bowel prep at home he presents for this procedure.  Past Medical History:  Diagnosis Date  . Diverticulitis     Past Surgical History:  Procedure Laterality Date  . HERNIA REPAIR     inguinal as baby  . Takedown Hartman colostomy     Dr. Johna SheriffHoxworth 10/25/2017       Hartmann colectomy 06/05/2017  History reviewed. No pertinent family history. Social History:  reports that he quit smoking about 5 months ago. His smoking use included cigarettes. He has a 20.00 pack-year smoking history. he has never used smokeless tobacco. He reports that he drinks alcohol. He reports that he does not use drugs.  Allergies: No Known Allergies  Medications Prior to Admission  Medication Sig Dispense Refill  . dicyclomine (BENTYL) 20 MG tablet Take 1 tablet (20 mg total) by mouth 2 (two) times daily. (Patient not taking: Reported on 10/13/2017) 20 tablet 0  . HYDROcodone-acetaminophen (NORCO/VICODIN) 5-325 MG tablet Take 1-2 tablets by mouth every 4 (four) hours as needed for moderate pain. (Patient not taking: Reported on 10/13/2017) 30 tablet 0  . ondansetron (ZOFRAN ODT) 4 MG disintegrating tablet Take 1 tablet (4 mg total) by mouth every 8 (eight) hours as needed for nausea or vomiting. (Patient not taking: Reported on 10/13/2017) 20 tablet 0    No results found for this or any previous visit (from the past 48 hour(s)). No results found.  Review of Systems  Constitutional: Negative.    HENT: Positive for congestion.   Respiratory: Negative.   Cardiovascular: Negative.   Gastrointestinal: Negative.   Genitourinary: Negative.     Blood pressure 126/89, pulse 95, temperature 97.8 F (36.6 C), temperature source Oral, resp. rate 18, height 5\' 9"  (1.753 m), weight 60.8 kg (134 lb), SpO2 95 %. Physical Exam  General: Alert, thin but healthy-appearing Caucasian male, in no distress Skin: Warm and dry without rash or infection. HEENT: No palpable masses or thyromegaly. Sclera nonicteric. Pupils equal round and reactive.  Lymph nodes: No cervical, supraclavicular, or inguinal nodes palpable. Lungs: Breath sounds clear and equal without increased work of breathing Cardiovascular: Regular rate and rhythm without murmur. No JVD or edema. Peripheral pulses intact. Abdomen: Nondistended. Soft and nontender. No masses palpable. No organomegaly. No palpable hernias.  Well-healed midline incision below the umbilicus.  Ostomy present left lower quadrant. Extremities: No edema or joint swelling or deformity. No chronic venous stasis changes. Neurologic: Alert and fully oriented.  Affect normal.  No gross motor deficits.  Assessment/Plan Takedown of Hartmann colostomy.  I discussed the procedure with the patient including indications and alternatives, risks including general anesthesia, cardiorespiratory complications, bleeding, infection, injury to surrounding structures or anastomotic leak requiring redo colostomy.  He has tolerated his antibiotic and mechanical bowel prep.  All questions answered.  Mariella SaaHOXWORTH,Timiko Offutt T, MD 10/25/2017, 8:10 AM

## 2017-10-25 NOTE — Transfer of Care (Signed)
Immediate Anesthesia Transfer of Care Note  Patient: Colton Stanley  Procedure(s) Performed: TAKEDOWN OF HARTMAN COLOSTOMY ERAS PATHWAY (N/A )  Patient Location: PACU  Anesthesia Type:General  Level of Consciousness: sedated  Airway & Oxygen Therapy: Patient Spontanous Breathing and Patient connected to face mask oxygen  Post-op Assessment: Report given to RN and Post -op Vital signs reviewed and stable  Post vital signs: Reviewed and stable  Last Vitals:  Vitals:   10/25/17 0638  BP: 126/89  Pulse: 95  Resp: 18  Temp: 36.6 C  SpO2: 95%    Last Pain:  Vitals:   10/25/17 0638  TempSrc: Oral      Patients Stated Pain Goal: 4 (10/25/17 0707)  Complications: No apparent anesthesia complications

## 2017-10-25 NOTE — Anesthesia Procedure Notes (Signed)
Procedure Name: Intubation Date/Time: 10/25/2017 8:33 AM Performed by: Lind Covert, CRNA Pre-anesthesia Checklist: Patient identified, Emergency Drugs available, Suction available, Patient being monitored and Timeout performed Patient Re-evaluated:Patient Re-evaluated prior to induction Oxygen Delivery Method: Circle system utilized Preoxygenation: Pre-oxygenation with 100% oxygen Induction Type: IV induction Ventilation: Mask ventilation without difficulty Laryngoscope Size: Mac and 4 Grade View: Grade I Tube type: Oral Tube size: 7.5 mm Number of attempts: 1 Airway Equipment and Method: Stylet Placement Confirmation: ETT inserted through vocal cords under direct vision,  positive ETCO2 and breath sounds checked- equal and bilateral Secured at: 21 cm Tube secured with: Tape Dental Injury: Teeth and Oropharynx as per pre-operative assessment

## 2017-10-25 NOTE — Anesthesia Postprocedure Evaluation (Signed)
Anesthesia Post Note  Patient: Colton Stanley  Procedure(s) Performed: TAKEDOWN OF HARTMAN COLOSTOMY ERAS PATHWAY (N/A )     Patient location during evaluation: PACU Anesthesia Type: General Level of consciousness: awake and alert Pain management: pain level controlled Vital Signs Assessment: post-procedure vital signs reviewed and stable Respiratory status: spontaneous breathing, nonlabored ventilation and respiratory function stable Cardiovascular status: blood pressure returned to baseline and stable Postop Assessment: no apparent nausea or vomiting Anesthetic complications: no    Last Vitals:  Vitals:   10/25/17 1145 10/25/17 1200  BP: 125/87 124/83  Pulse: 69 69  Resp: 10 12  Temp:  36.7 C  SpO2: 97% 98%    Last Pain:  Vitals:   10/25/17 1200  TempSrc:   PainSc: 2                  Beryle Lathehomas E Luciann Gossett

## 2017-10-26 LAB — BASIC METABOLIC PANEL
ANION GAP: 4 — AB (ref 5–15)
BUN: 8 mg/dL (ref 6–20)
CHLORIDE: 106 mmol/L (ref 101–111)
CO2: 26 mmol/L (ref 22–32)
CREATININE: 0.8 mg/dL (ref 0.61–1.24)
Calcium: 8.3 mg/dL — ABNORMAL LOW (ref 8.9–10.3)
GFR calc non Af Amer: >60 mL/min (ref >60)
Glucose, Bld: 126 mg/dL — ABNORMAL HIGH (ref 65–99)
POTASSIUM: 4.2 mmol/L (ref 3.5–5.1)
SODIUM: 136 mmol/L (ref 135–145)

## 2017-10-26 LAB — CBC
HEMATOCRIT: 40.8 % (ref 39.0–52.0)
HEMOGLOBIN: 13.6 g/dL (ref 13.0–17.0)
MCH: 30 pg (ref 26.0–34.0)
MCHC: 33.3 g/dL (ref 30.0–36.0)
MCV: 89.9 fL (ref 78.0–100.0)
PLATELETS: 285 10*3/uL (ref 150–400)
RBC: 4.54 MIL/uL (ref 4.22–5.81)
RDW: 12.3 % (ref 11.5–15.5)
WBC: 12.3 10*3/uL — AB (ref 4.0–10.5)

## 2017-10-26 NOTE — Progress Notes (Signed)
Patient ID: Jodell CiproMalcolm Philson, male   DOB: 27-Sep-1966, 51 y.o.   MRN: [id number] 1 Day Post-Op   Subjective: Doing well this morning.  Some incisional pain but controlled with medications.  A little bloating.  Feels rumbling in his stomach.  Tolerating clear liquids without nausea.  Foley just out.  Has been ambulating.  Objective: Vital signs in last 24 hours: Temp:  [98 F (36.7 C)-99 F (37.2 C)] 98.3 F (36.8 C) (11/08 0545) Pulse Rate:  [57-70] 61 (11/08 0545) Resp:  [10-18] 16 (11/08 0545) BP: (112-135)/(66-90) 127/69 (11/08 0545) SpO2:  [94 %-100 %] 95 % (11/08 0545) Last BM Date: 10/24/17  Intake/Output from previous day: 11/07 0701 - 11/08 0700 In: 4666.7 [P.O.:420; I.V.:4246.7] Out: 2375 [Urine:2275; Blood:100] Intake/Output this shift: No intake/output data recorded.  General appearance: alert, cooperative and no distress GI: Minimal distention.  Mild diffuse tenderness without guarding. Incision/Wound: No erythema or drainage  Lab Results:  Recent Labs    10/26/17 0446  WBC 12.3*  HGB 13.6  HCT 40.8  PLT 285   BMET Recent Labs    10/26/17 0446  NA 136  K 4.2  CL 106  CO2 26  GLUCOSE 126*  BUN 8  CREATININE 0.80  CALCIUM 8.3*     Studies/Results: No results found.  Anti-infectives: Anti-infectives (From admission, onward)   Start     Dose/Rate Route Frequency Ordered Stop   10/25/17 0807  cefoTEtan in Dextrose 5% (CEFOTAN) 2-2.08 GM-% IVPB    Comments:  Carmelia Rollerlday, Stephen   : cabinet override      10/25/17 0807 10/25/17 0840   10/25/17 0653  cefoTEtan in Dextrose 5% (CEFOTAN) IVPB 2 g     2 g Intravenous On call to O.R. 10/25/17 0653 10/25/17 0840   10/25/17 0653  neomycin (MYCIFRADIN) tablet 1,000 mg  Status:  Discontinued     1,000 mg Oral 3 times per day 10/25/17 0653 10/25/17 0657   10/25/17 0653  metroNIDAZOLE (FLAGYL) tablet 1,000 mg  Status:  Discontinued     1,000 mg Oral 3 times per day 10/25/17 0653 10/25/17 0657       Assessment/Plan: s/p Procedure(s): TAKEDOWN OF HARTMAN COLOSTOMY ERAS PATHWAY Doing well postoperatively without apparent complication.  Advance to full liquid diet.  Ambulation encouraged.   LOS: 1 day    Valta Dillon T 10/26/2017

## 2017-10-27 LAB — CBC
HCT: 41.2 % (ref 39.0–52.0)
Hemoglobin: 13.8 g/dL (ref 13.0–17.0)
MCH: 30.5 pg (ref 26.0–34.0)
MCHC: 33.5 g/dL (ref 30.0–36.0)
MCV: 90.9 fL (ref 78.0–100.0)
PLATELETS: 260 10*3/uL (ref 150–400)
RBC: 4.53 MIL/uL (ref 4.22–5.81)
RDW: 12.4 % (ref 11.5–15.5)
WBC: 8.4 10*3/uL (ref 4.0–10.5)

## 2017-10-27 MED ORDER — OXYCODONE-ACETAMINOPHEN 5-325 MG PO TABS: 1.0000 | ORAL_TABLET | Freq: Four times a day (QID) | ORAL | 0 refills | Status: AC | PRN

## 2017-10-27 MED ORDER — DICYCLOMINE HCL 20 MG PO TABS: 20.0000 mg | ORAL_TABLET | Freq: Two times a day (BID) | ORAL | 0 refills | Status: AC

## 2017-10-27 MED ORDER — ONDANSETRON 4 MG PO TBDP: 4.0000 mg | ORAL_TABLET | Freq: Three times a day (TID) | ORAL | 0 refills | Status: AC | PRN

## 2017-10-27 NOTE — Discharge Instructions (Signed)
[facility]      [facility] [phone]  OPEN ABDOMINAL SURGERY: POST OP INSTRUCTIONS  Always review your discharge instruction sheet given to you by the facility where your surgery was performed.  IF YOU HAVE DISABILITY OR FAMILY LEAVE FORMS, YOU MUST BRING THEM TO THE OFFICE FOR PROCESSING.  PLEASE DO NOT GIVE THEM TO YOUR DOCTOR.  1. A prescription for pain medication may be given to you upon discharge.  Take your pain medication as prescribed, if needed.  If narcotic pain medicine is not needed, then you may take acetaminophen (Tylenol) or ibuprofen (Advil) as needed. 2. Take your usually prescribed medications unless otherwise directed. 3. If you need a refill on your pain medication, please contact your pharmacy. They will contact our office to request authorization.  Prescriptions will not be filled after 5pm or on week-ends. 4. You should follow a light diet the first few days after arrival home, such as soup and crackers, pudding, etc.unless your doctor has advised otherwise. A high-fiber, low fat diet can be resumed as tolerated.   Be sure to include lots of fluids daily. Most patients will experience some swelling and bruising on the chest and neck area.  Ice packs will help.  Swelling and bruising can take several days to resolve 5. Most patients will experience some swelling and bruising in the area of the incision. Ice pack will help. Swelling and bruising can take several days to resolve..  6. It is common to experience some constipation if taking pain medication after surgery.  Increasing fluid intake and taking a stool softener will usually help or prevent this problem from occurring.  A mild laxative (Milk of Magnesia or Miralax) should be taken according to package directions if there are no bowel movements after 48 hours. 7.  You may have steri-strips (small skin tapes) in place directly over the incision.  These strips should be left on the skin for 7-10 days.  If your  surgeon used skin glue on the incision, you may shower in 24 hours.  The glue will flake off over the next 2-3 weeks.  Any sutures or staples will be removed at the office during your follow-up visit. You may find that a light gauze bandage over your incision may keep your staples from being rubbed or pulled. You may shower and replace the bandage daily. 8. ACTIVITIES:  You may resume regular (light) daily activities beginning the next day--such as daily self-care, walking, climbing stairs--gradually increasing activities as tolerated.  You may have sexual intercourse when it is comfortable.  Refrain from any heavy lifting or straining until approved by your doctor. a. You may drive when you no longer are taking prescription pain medication, you can comfortably wear a seatbelt, and you can safely maneuver your car and apply brakes b. Return to Work: ___________________________________ 9. You should see your doctor in the office for a follow-up appointment approximately two weeks after your surgery.  Make sure that you call for this appointment within a day or two after you arrive home to insure a convenient appointment time. OTHER INSTRUCTIONS:  _____________________________________________________________ _____________________________________________________________  WHEN TO CALL YOUR DOCTOR: 1. Fever over 101.0 2. Inability to urinate 3. Nausea and/or vomiting 4. Extreme swelling or bruising 5. Continued bleeding from incision. 6. Increased pain, redness, or drainage from the incision. 7. Difficulty swallowing or breathing 8. Muscle cramping or spasms. 9. Numbness or tingling in hands or feet or around lips.  The clinic staff is available to   answer your questions during regular business hours.  Please don't hesitate to call and ask to speak to one of the nurses if you have concerns.  For further questions, please visit [email or web address]   

## 2017-10-27 NOTE — Discharge Summary (Signed)
  Patient ID: Colton Stanley [id number] 51 y.o. 05/01/1966  10/25/2017  Discharge date and time: 10/27/2017   Admitting Physician: Glenna FellowsHOXWORTH,Keary Hanak T  Discharge Physician: Glenna FellowsHOXWORTH,Berlin Viereck T  Admission Diagnoses: colostomy status  Discharge Diagnoses: Same  Operations: Procedure(s): TAKEDOWN OF HARTMAN COLOSTOMY ERAS PATHWAY  Admission Condition: good  Discharged Condition: good  Indication for Admission: Patient is about 6 months status post emergency Hartman colectomy for perforated diverticulitis.  He has done well and following a mechanical antibiotic bowel prep at home is admitted for colostomy takedown.  Hospital Course: On the morning of admission the patient underwent an uneventful takedown of his Hartmann colostomy.  His postoperative course was very smooth.  He had excellent pain control.  Started on clear liquids initially which he tolerated and moved to full liquids on the first postoperative day.  On the second postoperative day he is tolerating full liquids well and has had several loose bowel movements.  Pain is well controlled on oral medications.  Abdomen is benign and incisions are clean.  He is felt ready for discharge.   Disposition: Home  Patient Instructions:  Allergies as of 10/27/2017   No Known Allergies     Medication List    STOP taking these medications   HYDROcodone-acetaminophen 5-325 MG tablet Commonly known as:  NORCO/VICODIN     TAKE these medications   dicyclomine 20 MG tablet Commonly known as:  BENTYL Take 1 tablet (20 mg total) by mouth 2 (two) times daily.   ondansetron 4 MG disintegrating tablet Commonly known as:  ZOFRAN ODT Take 1 tablet (4 mg total) by mouth every 8 (eight) hours as needed for nausea or vomiting.   oxyCODONE-acetaminophen 5-325 MG tablet Commonly known as:  PERCOCET/ROXICET Take 1 tablet every 6 (six) hours as needed by mouth for moderate pain.       Activity: no heavy lifting for 4 weeks Diet:  regular diet Wound Care: none needed  Follow-up:  With Dr. Johna SheriffHoxworth in 2 weeks.  Signed: Mariella SaaBenjamin T Vernal Rutan MD, FACS  10/27/2017, 8:34 AM

## 2017-10-27 NOTE — Progress Notes (Signed)
Patient ID: Colton Stanley, male   DOB: 12-07-1966, 51 y.o.   MRN: [id number] 2 Days Post-Op   Subjective: No complaints this morning.  Has had several loose bowel movements.  Tolerating full liquids without nausea.  Pain controlled with oral meds.  Objective: Vital signs in last 24 hours: Temp:  [98 F (36.7 C)-98.4 F (36.9 C)] 98 F (36.7 C) (11/09 0523) Pulse Rate:  [52-72] 58 (11/09 0523) Resp:  [16-18] 18 (11/09 0232) BP: (100-140)/(57-91) 122/81 (11/09 0523) SpO2:  [94 %-97 %] 95 % (11/09 0523) Last BM Date: 10/26/17  Intake/Output from previous day: 11/08 0701 - 11/09 0700 In: 915.4 [P.O.:240; I.V.:675.4] Out: 1001 [Urine:1000; Stool:1] Intake/Output this shift: Total I/O In: -  Out: 1050 [Urine:1050]  General appearance: alert, cooperative and no distress GI: normal findings: soft, non-tender Incision/Wound: Clean and dry without erythema  Lab Results:  Recent Labs    10/26/17 0446 10/27/17 0434  WBC 12.3* 8.4  HGB 13.6 13.8  HCT 40.8 41.2  PLT 285 260   BMET Recent Labs    10/26/17 0446  NA 136  K 4.2  CL 106  CO2 26  GLUCOSE 126*  BUN 8  CREATININE 0.80  CALCIUM 8.3*     Studies/Results: No results found.  Anti-infectives: Anti-infectives (From admission, onward)   Start     Dose/Rate Route Frequency Ordered Stop   10/25/17 0807  cefoTEtan in Dextrose 5% (CEFOTAN) 2-2.08 GM-% IVPB    Comments:  Carmelia Rollerlday, Stephen   : cabinet override      10/25/17 0807 10/25/17 0840   10/25/17 0653  cefoTEtan in Dextrose 5% (CEFOTAN) IVPB 2 g     2 g Intravenous On call to O.R. 10/25/17 0653 10/25/17 0840   10/25/17 0653  neomycin (MYCIFRADIN) tablet 1,000 mg  Status:  Discontinued     1,000 mg Oral 3 times per day 10/25/17 0653 10/25/17 0657   10/25/17 0653  metroNIDAZOLE (FLAGYL) tablet 1,000 mg  Status:  Discontinued     1,000 mg Oral 3 times per day 10/25/17 0653 10/25/17 0657      Assessment/Plan: s/p Procedure(s): TAKEDOWN OF HARTMAN COLOSTOMY  ERAS PATHWAY Doing very well postoperatively.  Okay for discharge today.   LOS: 2 days    Katriona Schmierer T 10/27/2017

## 2017-10-27 NOTE — Progress Notes (Signed)
Pt was alert, oriented, ambulating, and tolerating diet.  D/C instructions and prescription was given. All questions answered. Pt was d/cd home.

## 2018-05-09 IMAGING — CT CT ABD-PELV W/ CM
2 of 5 series · 16 of 46 positions shown, 18 images · IV contrast (ISOVUE)
Comparison: None.

CLINICAL DATA: Upper abdominal pain.

EXAM:
CT ABDOMEN AND PELVIS WITH CONTRAST
TECHNIQUE: Multidetector CT imaging of the abdomen and pelvis was performed
using the standard protocol following bolus administration of
intravenous contrast.
CONTRAST:  100 mL Vsovue-BUU

[Series 2: abd/pel with · axial · 0.64mm/px · z∈[-487,-102]mm · 13 of 91 slices shown, 15 images]
[im 7/91  soft-tissue]
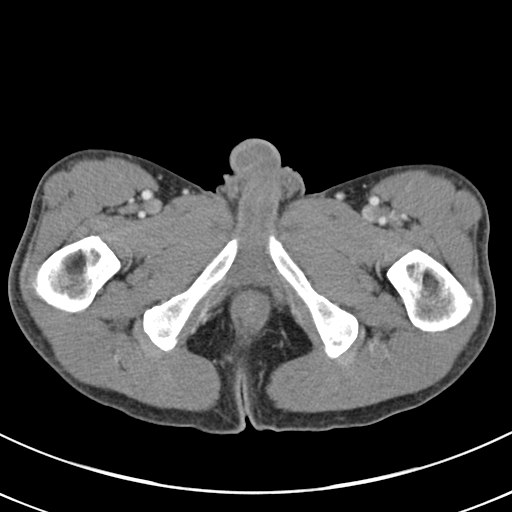
[im 7/91  bone]
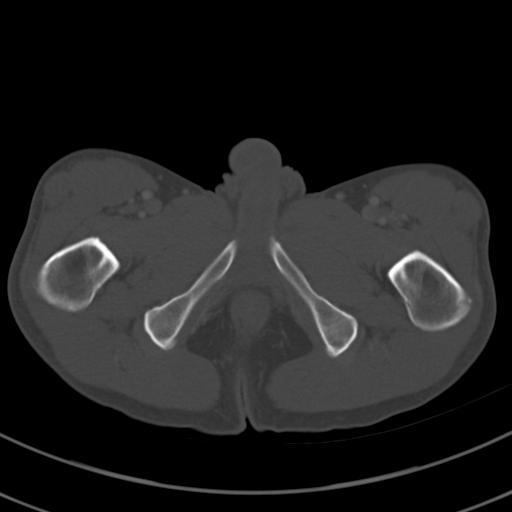
[im 13/91  soft-tissue]
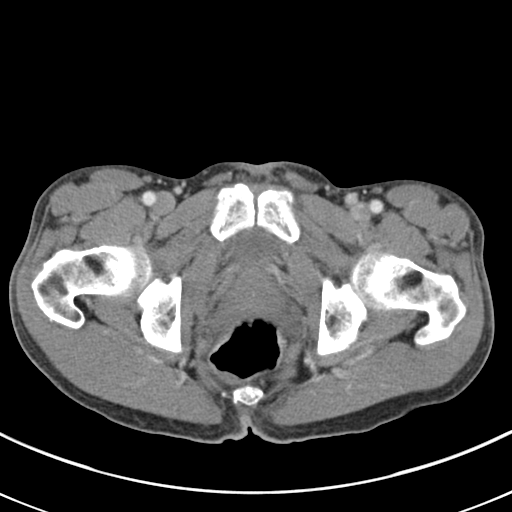
[im 20/91  soft-tissue]
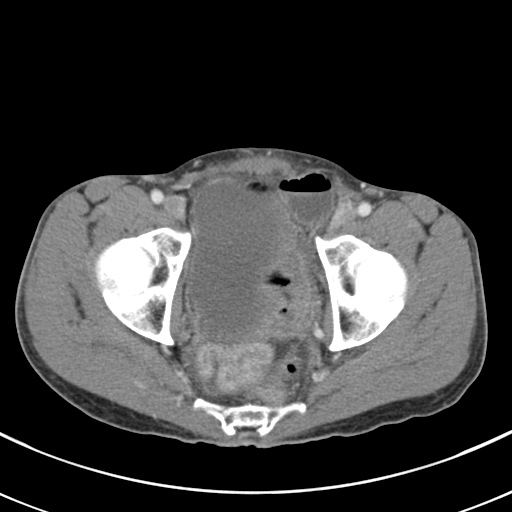
[im 26/91  soft-tissue]
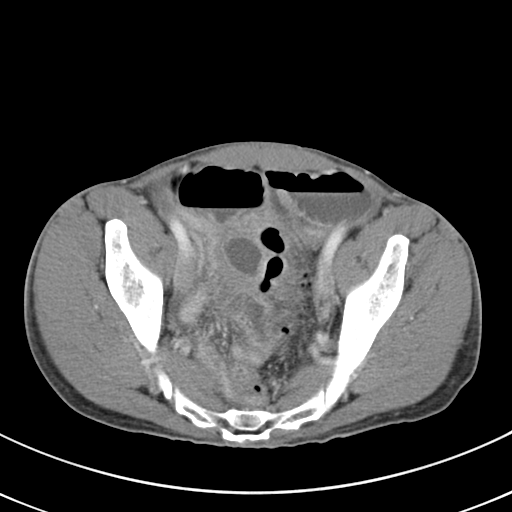
[im 33/91  soft-tissue]
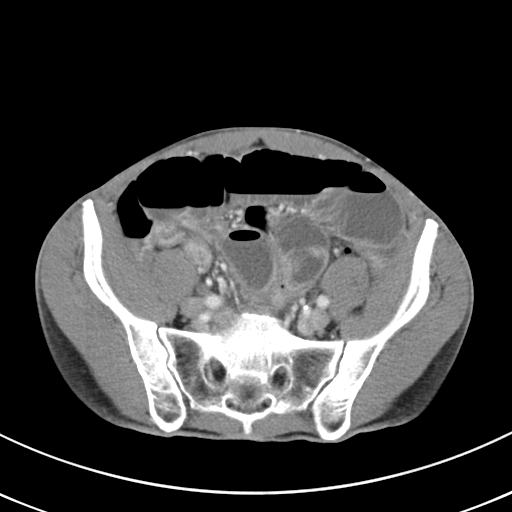
[im 39/91  soft-tissue]
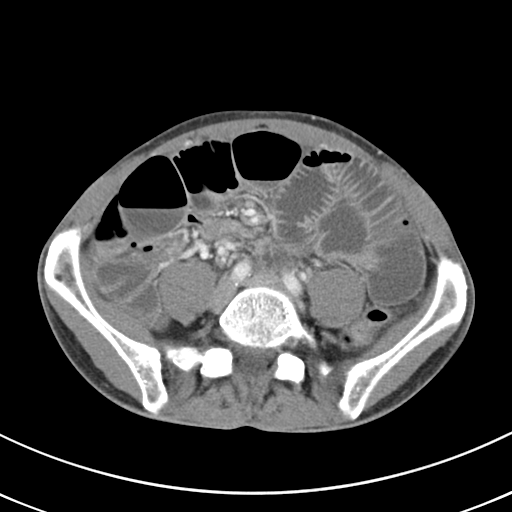
[im 46/91  soft-tissue]
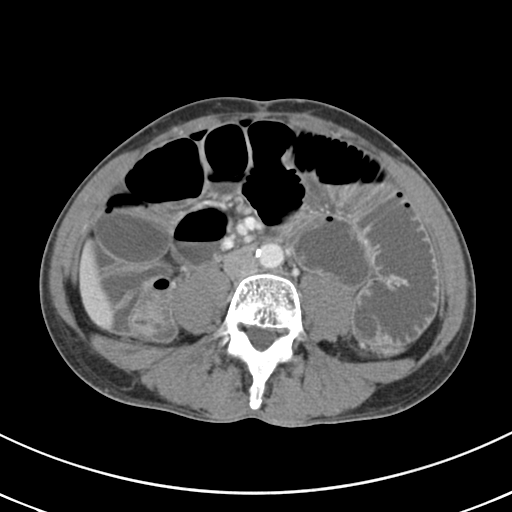
[im 52/91  soft-tissue]
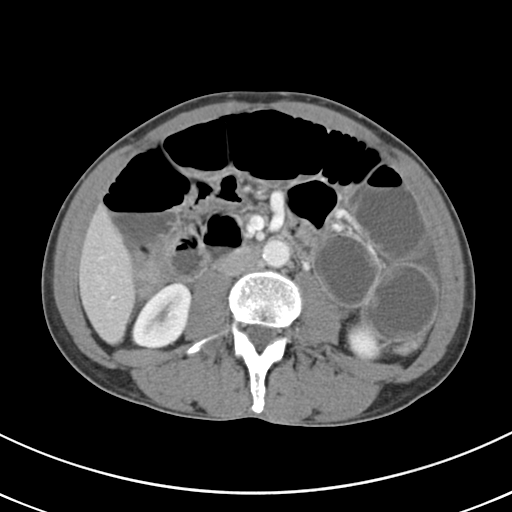
[im 58/91  soft-tissue]
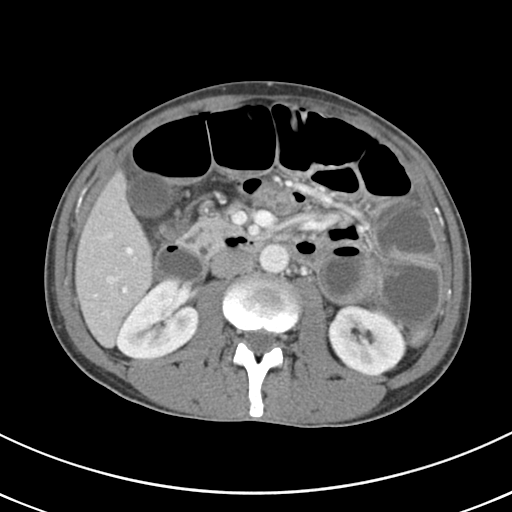
[im 58/91  bone]
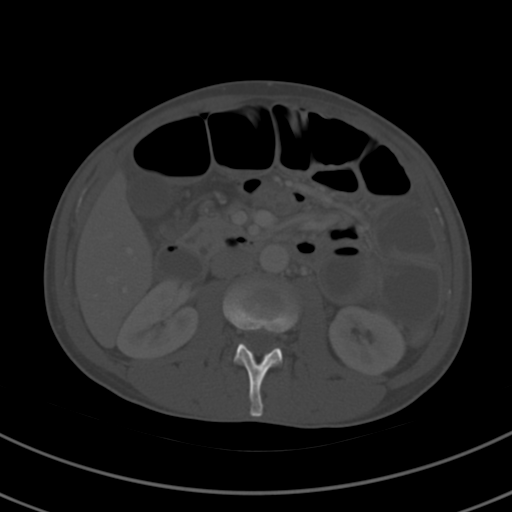
[im 65/91  soft-tissue]
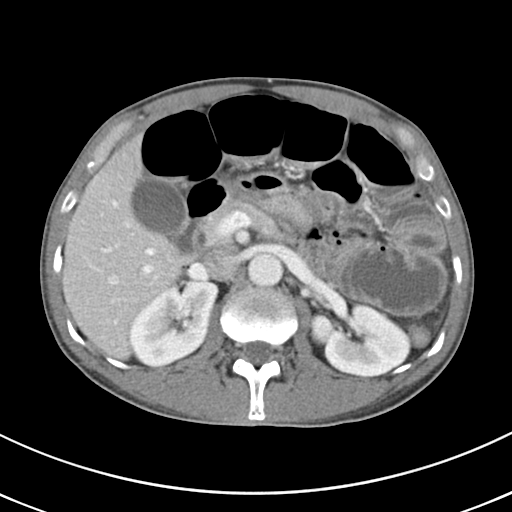
[im 71/91  soft-tissue]
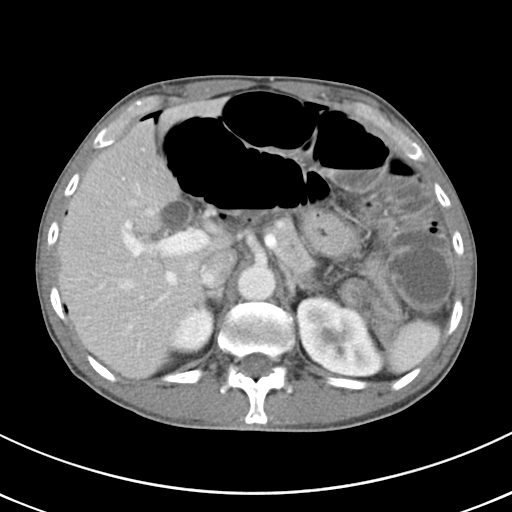
[im 78/91  soft-tissue]
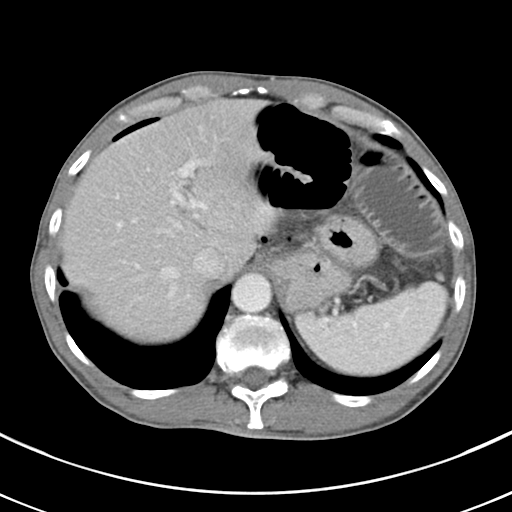
[im 84/91  soft-tissue]
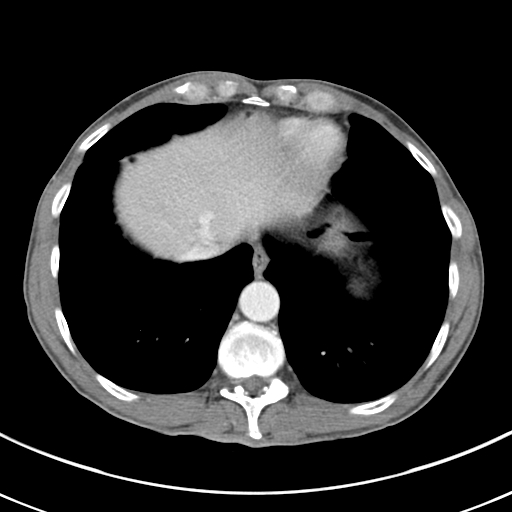

[Series 7: coronal a/|p · coronal · 0.64mm/px · 3 of 128 slices shown]
[im 43/128  soft-tissue]
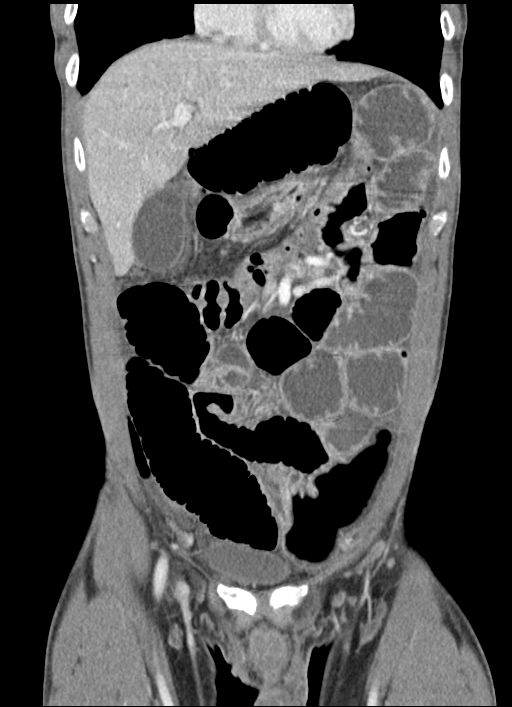
[im 57/128  soft-tissue]
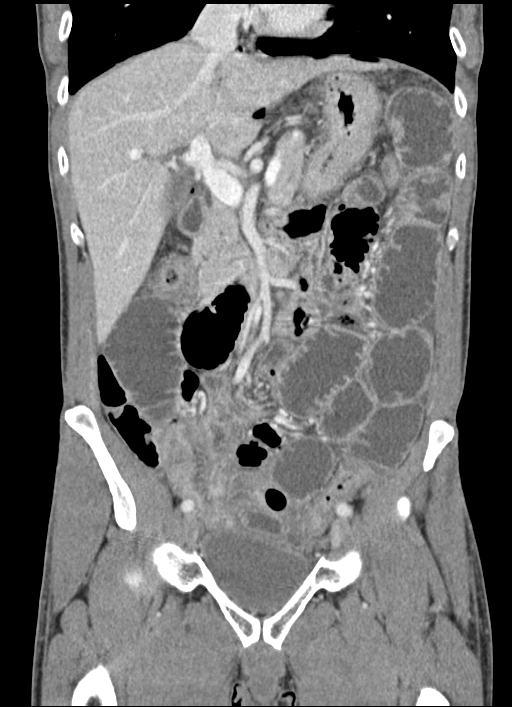
[im 71/128  soft-tissue]
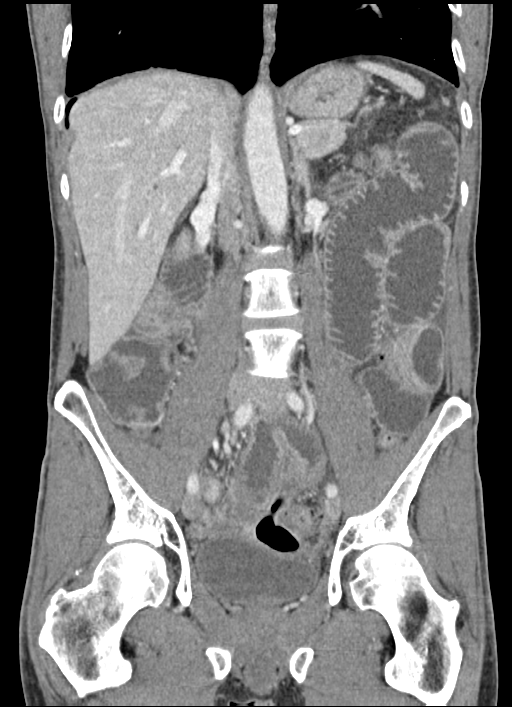

[16 of 46 positions shown; findings below may reference images not displayed]

FINDINGS: Lower chest: No acute abnormality.

Hepatobiliary: No focal liver abnormality is seen. No gallstones,
gallbladder wall thickening, or biliary dilatation.

Pancreas: Unremarkable. No pancreatic ductal dilatation or
surrounding inflammatory changes.

Spleen: Normal in size without focal abnormality.

Adrenals/Urinary Tract: Adrenal glands are unremarkable. Kidneys are
normal, without renal calculi, focal lesion, or hydronephrosis.
Bladder is unremarkable.

Stomach/Bowel: Severe small bowel dilatation with a relative
transition zone in the lower pelvis where there is bowel wall
thickening. Small volume pneumoperitoneum most consistent with bowel
perforation. Sigmoid diverticulosis without evidence of
diverticulitis. No abdominal or pelvic free fluid.

Vascular/Lymphatic: Mild abdominal aortic atherosclerosis. No
lymphadenopathy.

Reproductive: Prostate is unremarkable.

Other: No abdominal wall hernia or abnormality. No abdominopelvic
ascites.

Musculoskeletal: No acute osseous abnormality. No lytic or sclerotic
osseous lesion.
IMPRESSION: 1. Small-bowel obstruction with pneumoperitoneum concerning for
bowel perforation. Relative transition zone in the lower pelvis.
Critical Value/emergent results were called by telephone at the time
of interpretation on 06/05/2017 at [DATE] to Dr. ANGELO MARIA ROSS , who
verbally acknowledged these results.

## 2018-08-15 IMAGING — RF DG BE W/ CM - WO/W KUB
1 series · 13 of 13 positions shown · IV contrast (agent unspecified)
Comparison: CT scan 06/05/2017

CLINICAL DATA: Status post sigmoid colon resection and colostomy
for perforated sigmoid diverticulitis and associated small bowel
obstruction. Preop for reversal of colostomy.

EXAM:
BE WITH CONTRAST - WITHOUT AND WITH KUB
CONTRAST:  Barium
FLUOROSCOPY TIME:  Fluoroscopy Time:  2 minutes and 18 seconds
Radiation Exposure Index (if provided by the fluoroscopic device):
267 mGy
Number of Acquired Spot Images: 3

[Series 1: one shot · 13 of 13 slices shown]
[im 1/13]
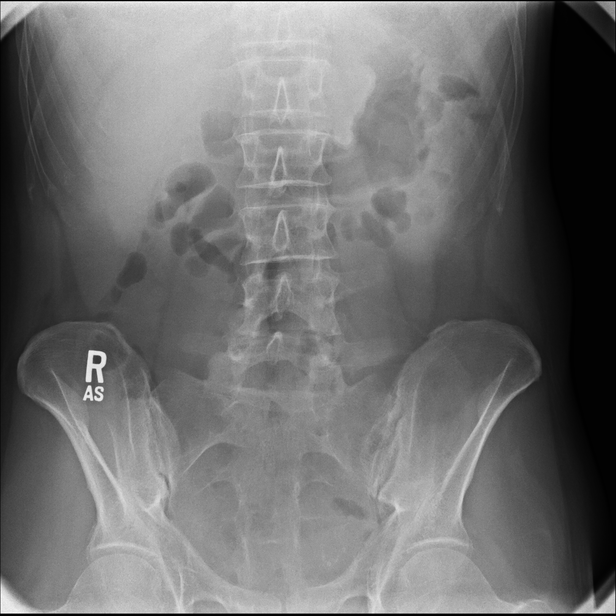
[im 2/13]
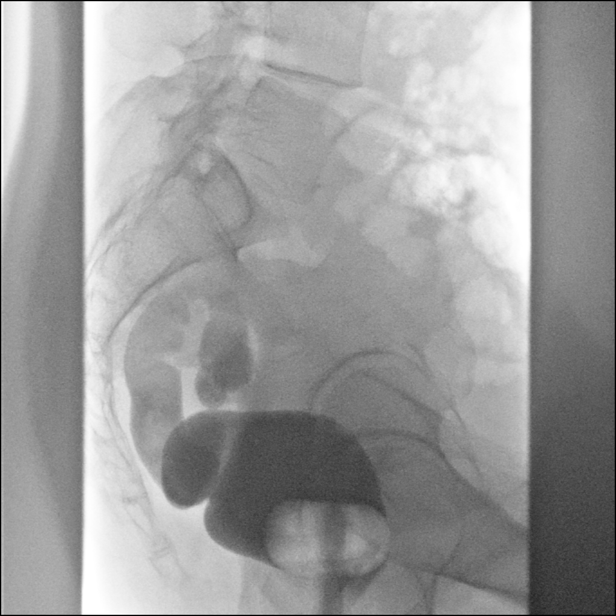
[im 3/13]
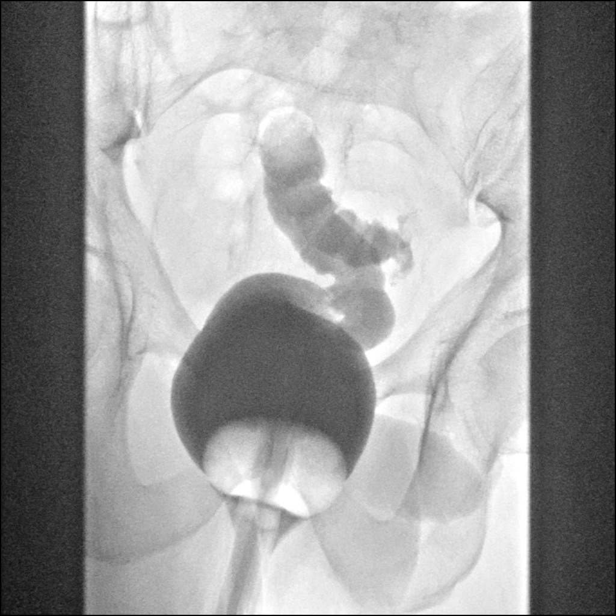
[im 4/13]
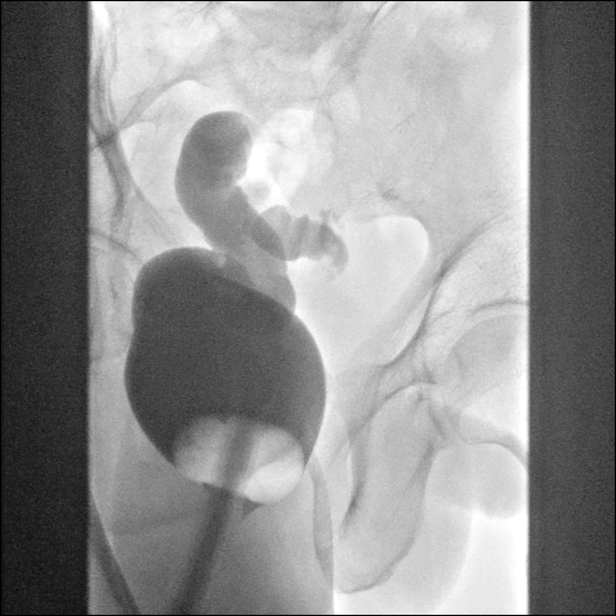
[im 5/13]
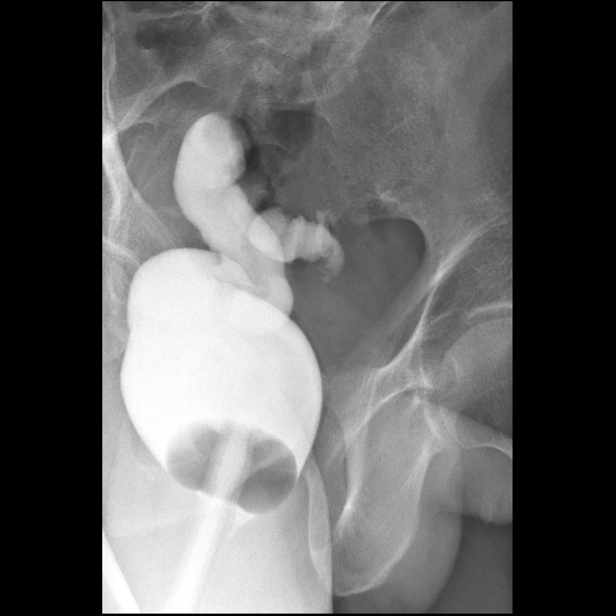
[im 6/13]
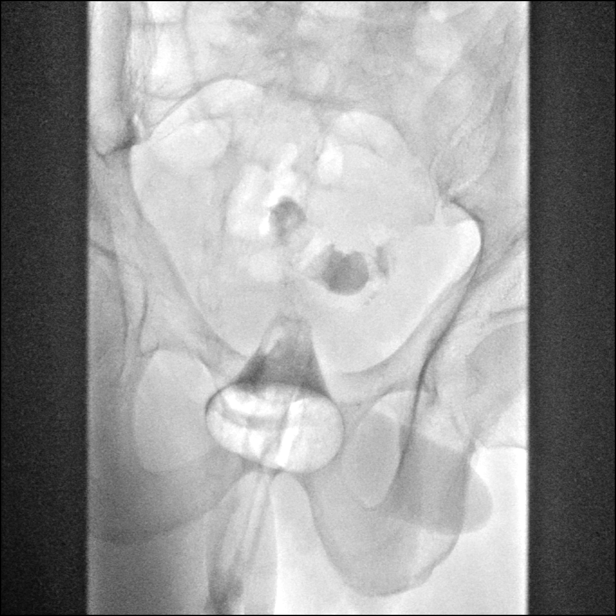
[im 7/13]
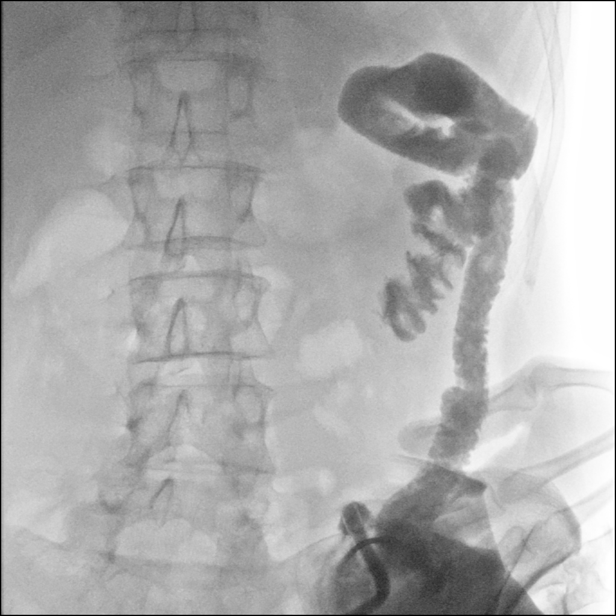
[im 8/13]
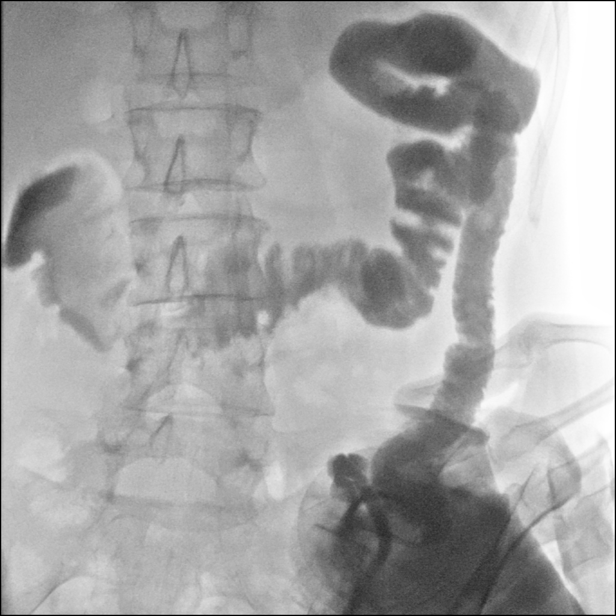
[im 9/13]
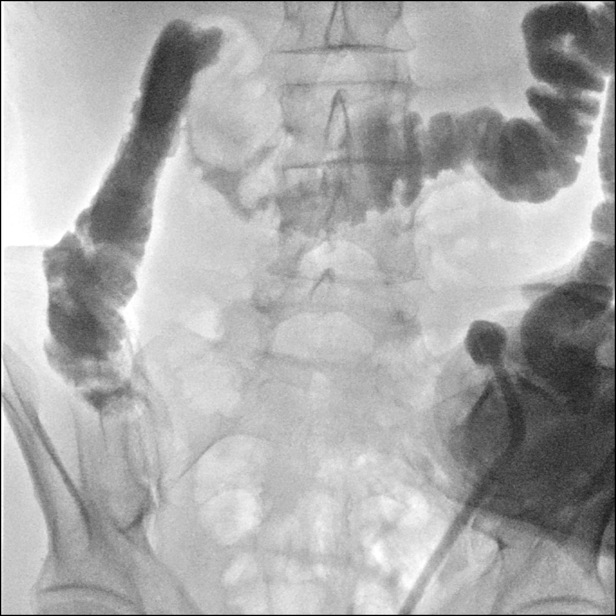
[im 10/13]
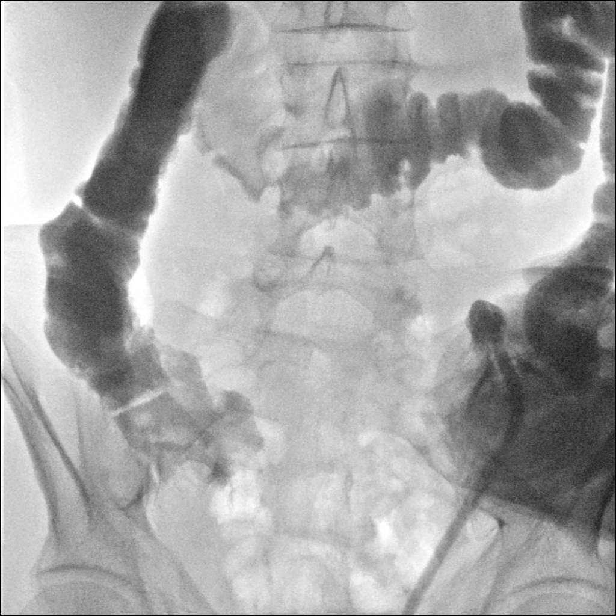
[im 11/13]
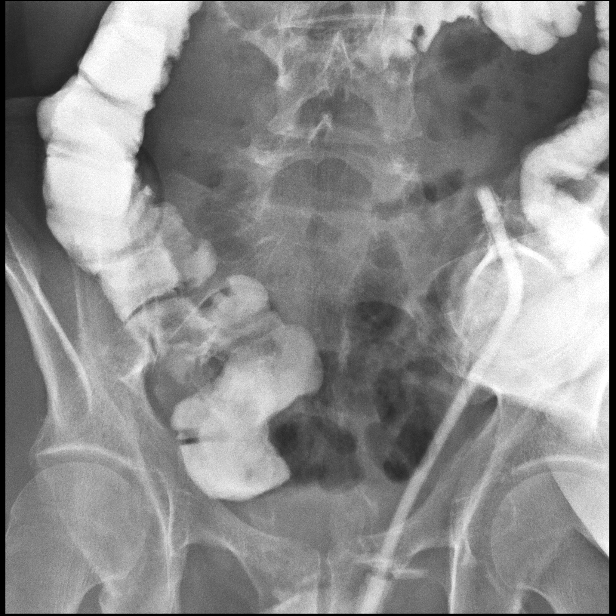
[im 12/13]
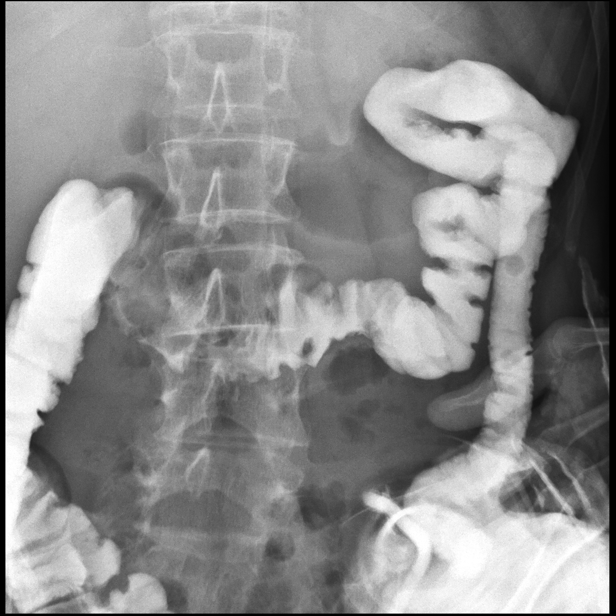
[im 13/13]
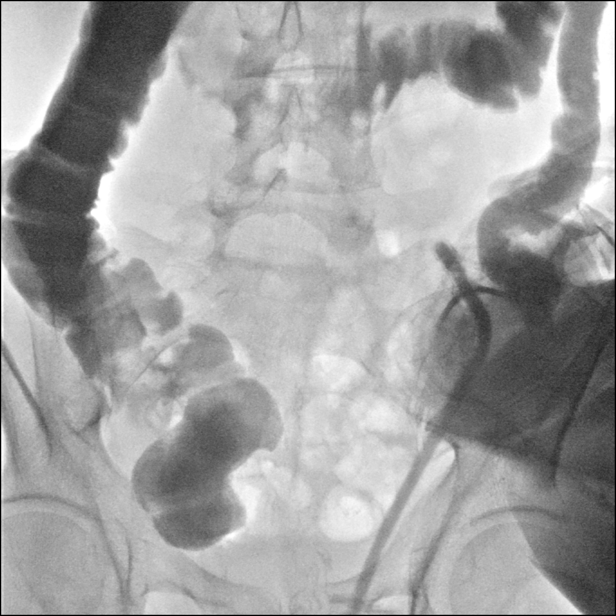

[13 of 13 positions shown; findings below may reference images not displayed]

FINDINGS: Examination via the rectum demonstrates normal appearance of the
rectum and proximal and mid sigmoid colon. A few sigmoid diverticuli
are noted. No leakage of contrast was demonstrated.

Subsequently, the left lower quadrant colostomy was catheterized and
installation of contrast demonstrates normal appearance of the
remainder of the colon. No worrisome lesions or obstructive
findings. The cecum is in the right lower quadrant adjacent to the
bladder.
IMPRESSION: 1. Normal appearance of the rectum and proximal to mid sigmoid colon
to the sigmoid closure. A few sigmoid diverticuli are noted.
2. Normal appearance of the remainder of the colon.

## 2019-07-17 ENCOUNTER — Other Ambulatory Visit: Payer: Self-pay | Admitting: General Practice

## 2019-07-17 DIAGNOSIS — Z20822 Contact with and (suspected) exposure to covid-19: Secondary | ICD-10-CM

## 2019-07-18 LAB — NOVEL CORONAVIRUS, NAA: SARS-CoV-2, NAA: NOT DETECTED

## 2019-12-10 ENCOUNTER — Ambulatory Visit: Payer: HRSA Program | Attending: Internal Medicine

## 2019-12-10 DIAGNOSIS — Z20822 Contact with and (suspected) exposure to covid-19: Secondary | ICD-10-CM

## 2019-12-10 DIAGNOSIS — Z20828 Contact with and (suspected) exposure to other viral communicable diseases: Secondary | ICD-10-CM | POA: Diagnosis not present

## 2019-12-12 LAB — NOVEL CORONAVIRUS, NAA: SARS-CoV-2, NAA: NOT DETECTED

## 2020-02-11 ENCOUNTER — Ambulatory Visit: Payer: BLUE CROSS/BLUE SHIELD | Attending: Internal Medicine

## 2020-02-11 DIAGNOSIS — Z20822 Contact with and (suspected) exposure to covid-19: Secondary | ICD-10-CM | POA: Insufficient documentation

## 2020-02-12 LAB — NOVEL CORONAVIRUS, NAA: SARS-CoV-2, NAA: NOT DETECTED

## 2020-02-21 ENCOUNTER — Ambulatory Visit: Payer: BLUE CROSS/BLUE SHIELD | Attending: Internal Medicine

## 2020-02-21 DIAGNOSIS — Z20822 Contact with and (suspected) exposure to covid-19: Secondary | ICD-10-CM

## 2020-02-22 LAB — NOVEL CORONAVIRUS, NAA: SARS-CoV-2, NAA: NOT DETECTED

## 2020-02-24 ENCOUNTER — Ambulatory Visit: Payer: BLUE CROSS/BLUE SHIELD | Attending: Internal Medicine

## 2020-02-24 DIAGNOSIS — Z20822 Contact with and (suspected) exposure to covid-19: Secondary | ICD-10-CM

## 2020-02-25 LAB — NOVEL CORONAVIRUS, NAA: SARS-CoV-2, NAA: NOT DETECTED

## 2020-02-26 ENCOUNTER — Ambulatory Visit: Payer: BLUE CROSS/BLUE SHIELD | Attending: Internal Medicine

## 2020-02-26 DIAGNOSIS — Z20822 Contact with and (suspected) exposure to covid-19: Secondary | ICD-10-CM

## 2020-02-27 LAB — NOVEL CORONAVIRUS, NAA: SARS-CoV-2, NAA: NOT DETECTED
